# Patient Record
Sex: Female | Born: 1950 | Race: Black or African American | Hispanic: No | Smoking: Smoker, current status unknown
Health system: Southern US, Community
[De-identification: ages and names within clinical notes are randomized; demographics above are authoritative.]

## PROBLEM LIST (undated history)

## (undated) DIAGNOSIS — F419 Anxiety disorder, unspecified: Secondary | ICD-10-CM

## (undated) DIAGNOSIS — F32A Depression, unspecified: Secondary | ICD-10-CM

## (undated) DIAGNOSIS — Z21 Asymptomatic human immunodeficiency virus [HIV] infection status: Secondary | ICD-10-CM

## (undated) DIAGNOSIS — B20 Human immunodeficiency virus [HIV] disease: Secondary | ICD-10-CM

## (undated) DIAGNOSIS — F329 Major depressive disorder, single episode, unspecified: Secondary | ICD-10-CM

## (undated) DIAGNOSIS — I10 Essential (primary) hypertension: Secondary | ICD-10-CM

---

## 2008-12-11 ENCOUNTER — Emergency Department (HOSPITAL_COMMUNITY): Admission: EM | Admit: 2008-12-11 | Discharge: 2008-12-11 | Payer: Self-pay | Admitting: Family Medicine

## 2009-04-03 ENCOUNTER — Ambulatory Visit: Payer: Self-pay | Admitting: Obstetrics & Gynecology

## 2010-02-24 ENCOUNTER — Emergency Department (HOSPITAL_COMMUNITY): Admission: EM | Admit: 2010-02-24 | Discharge: 2010-02-25 | Payer: Self-pay | Admitting: Emergency Medicine

## 2010-02-25 ENCOUNTER — Telehealth: Payer: Self-pay | Admitting: Gastroenterology

## 2010-03-06 ENCOUNTER — Ambulatory Visit: Payer: Self-pay | Admitting: Gastroenterology

## 2010-03-06 DIAGNOSIS — K5909 Other constipation: Secondary | ICD-10-CM

## 2010-03-06 DIAGNOSIS — R1013 Epigastric pain: Secondary | ICD-10-CM | POA: Insufficient documentation

## 2010-03-06 DIAGNOSIS — K219 Gastro-esophageal reflux disease without esophagitis: Secondary | ICD-10-CM

## 2010-03-11 ENCOUNTER — Ambulatory Visit (HOSPITAL_COMMUNITY): Admission: RE | Admit: 2010-03-11 | Discharge: 2010-03-11 | Payer: Self-pay | Admitting: Gastroenterology

## 2010-03-20 ENCOUNTER — Ambulatory Visit: Payer: Self-pay | Admitting: Gastroenterology

## 2010-04-30 ENCOUNTER — Emergency Department (HOSPITAL_COMMUNITY): Admission: EM | Admit: 2010-04-30 | Discharge: 2010-04-30 | Payer: Self-pay | Admitting: Family Medicine

## 2010-10-19 ENCOUNTER — Emergency Department (HOSPITAL_COMMUNITY): Admission: EM | Admit: 2010-10-19 | Discharge: 2010-10-19 | Payer: Self-pay | Admitting: Emergency Medicine

## 2010-12-23 NOTE — Progress Notes (Signed)
Summary: triage  Phone Note Call from Patient Call back at [phone]   Caller: Patient Call For: doc of the day Reason for Call: Talk to Nurse Summary of Call: Patient was in the ER last night with severe abd pain and nausea and was told to call uis and schedule an appt for today. (please call after 11am because pt had to go to pharmacy and pich up rx) Initial call taken by: Tawni Levy,  February 25, 2010 9:34 AM  Follow-up for Phone Call        Talked with pt.  States she was given phenergan and pepcd from  ER.  She just got them filled this am.  States she feels some better today.  ER instructed pt that she needed to see a Gi doctor.  Will get ER records for review. Follow-up by: Ashok Cordia RN,  February 25, 2010 12:37 PM  Additional Follow-up for Phone Call Additional follow up Details #1::        ER records reviewed.  Tried to call pt to sch appt.  No answer.   Ashok Cordia RN  February 26, 2010 8:59 AM  Talked with pt.  States she feels better today.  Appt sch for 03/06/10.  Offered appt on 03/04/10 but pt states she can not come in on 4/12 because she has a eye doctor appt.  instructed pt to call if worsens. Additional Follow-up by: Ashok Cordia RN,  February 26, 2010 11:27 AM

## 2010-12-23 NOTE — Assessment & Plan Note (Signed)
Summary: Abd pain,  ER follow up/dfs   History of Present Illness Visit Type: new patient  Primary GI MD: Sheryn Bison MD FACP FAGA Primary Provider: Micki Riley, DO  Requesting Provider: n/a Chief Complaint: loss of appetite, bloating, and generalized abd pain History of Present Illness:   60 year old African American female chronically HIV-positive palm Combivir and Sustiva b.i.d. per Dr.Koher Kennedy Bucker at [facility] in [place]. She has had regular followup at [facility] with a negative colonoscopy apparently several years ago. She comes to mild today because of recent emergency room visit on April 4 with sudden left upper quadrant abdominal pain, nausea vomiting, gas, bloating, and distention. Labs were all unremarkable as was CT scan of the abdomen and pelvis. She was placed on cimetidine 20 mg daily with p.r.n. promethazine, and is currently fairly asymptomatic.  She denies chronic abdominal complaints but does have periodic acid reflux. She specifically denies dysphagia,previous abdominal surgery, history of hepatitis or pancreatitis. However, she does have severe headaches and uses 4-5  Goody headache powders a day. She denies a known history of gallbladder disease. She also denies abuse of ethanol or cigarettes. He does have chronic anxiety and depression but is not on therapy. Old records are not available for review.   GI Review of Systems    Reports abdominal pain, bloating, and  loss of appetite.     Location of  Abdominal pain: generalized.    Denies acid reflux, belching, chest pain, dysphagia with liquids, dysphagia with solids, heartburn, nausea, vomiting, vomiting blood, weight loss, and  weight gain.        Denies anal fissure, black tarry stools, change in bowel habit, constipation, diarrhea, diverticulosis, fecal incontinence, heme positive stool, hemorrhoids, irritable bowel syndrome, jaundice, light color stool, liver problems, rectal bleeding, and  rectal  pain.    Current Medications (verified): 1)  Combivir 150-300 Mg Tabs (Lamivudine-Zidovudine) .... One Tablet By Mouth Two Times A Day 2)  Promethazine Hcl 25 Mg Tabs (Promethazine Hcl) .... One Tablet By Mouth As Needed For Nausea 3)  Famotidine 20 Mg Tabs (Famotidine) .... One Tablet By Mouth Once Daily 4)  Sustiva 600 Mg Tabs (Efavirenz) .... One Tablet By Mouth Once Daily  Allergies (verified): No Known Drug Allergies  Past History:  Past medical, surgical, family and social histories (including risk factors) reviewed for relevance to current acute and chronic problems.  Past Medical History: HIV Anxiety Disorder Depression  Past Surgical History: Reviewed history from 03/05/2010 and no changes required. Left leg Pinning due to fracture.  Family History: Reviewed history from 03/05/2010 and no changes required. Family History of Diabetes: Multiple Family Members  No FH of Colon Cancer: Family History of Breast Cancer:Mother, and Sister   Social History: Reviewed history from 03/05/2010 and no changes required. Unemployed Widowed No childern Patient currently smokes.  Alcohol Use - no Former Drug use Daily Caffeine Use: 2 daily   Review of Systems       The patient complains of anxiety-new, change in vision, cough, depression-new, headaches-new, night sweats, sleeping problems, and voice change.  The patient denies allergy/sinus, anemia, arthritis/joint pain, back pain, blood in urine, breast changes/lumps, confusion, coughing up blood, fainting, fatigue, fever, hearing problems, heart murmur, heart rhythm changes, itching, menstrual pain, muscle pains/cramps, nosebleeds, pregnancy symptoms, shortness of breath, skin rash, sore throat, swelling of feet/legs, swollen lymph glands, thirst - excessive , urination - excessive , urination changes/pain, urine leakage, and vision changes.    Vital Signs:  Patient profile:  60 year old female Height:      63 inches Weight:       131 pounds BMI:     23.29 BSA:     1.62 Pulse rate:   84 / minute Pulse rhythm:   regular BP sitting:   126 / 76  (left arm) Cuff size:   regular  Vitals Entered By: Ok Anis CMA (March 06, 2010 11:02 AM)  Physical Exam  General:  Well developed, well nourished, no acute distress.healthy appearing.   Head:  Normocephalic and atraumatic. Eyes:  PERRLA, no icterus.exam deferred to patient's ophthalmologist.   Neck:  Supple; no masses or thyromegaly. Lungs:  Clear throughout to auscultation. Heart:  Regular rate and rhythm; no murmurs, rubs,  or bruits. Abdomen:  Hepatic enlargement noted with enlarged right and left lobes of the liver and questionable palpable gallbladder in the right subcostal area. There is rather marked tenderness but no rebound. There is no abdominal bruit and bowel sounds are not obstructive. Extremities:  No clubbing, cyanosis, edema or deformities noted. Neurologic:  Alert and  oriented x4;  grossly normal neurologically. Cervical Nodes:  No significant cervical adenopathy. Inguinal Nodes:  No significant inguinal adenopathy. Psych:  Alert and cooperative. Normal mood and affect.   Impression & Recommendations:  Problem # 1:  ABDOMINAL PAIN, EPIGASTRIC (ICD-789.06) Assessment Improved Probable NSAID damage to the upper GI tract versus cholelithiasis. I'm somewhat concerned about herpetic enlargement and possible palpable gallbladder and we'll repeat her liver enzymes, amylase, lipase, and upper abdominal ultrasound. I've stopped NSAIDs and we'll give her p.r.n. tramadol 50 mg for headaches, Librax p.r.n., AcipHex 20 mg twice a day, and office followup in 2 weeks' time. We will send this report to Dr. Kennedy Bucker at [facility] for review. Also requested her copies of any previous endoscopic exams were history of GI problems. This patient may need endoscopic exam dependent on her clinical course. Orders: TLB-CBC Platelet - w/Differential  (85025-CBCD) TLB-BMP (Basic Metabolic Panel-BMET) (80048-METABOL) TLB-Hepatic/Liver Function Pnl (80076-HEPATIC) TLB-TSH (Thyroid Stimulating Hormone) (84443-TSH) TLB-B12, Serum-Total ONLY (91478-G95) TLB-Ferritin (82728-FER) TLB-Folic Acid (Folate) (82746-FOL) TLB-IBC Pnl (Iron/FE;Transferrin) (83550-IBC) TLB-Amylase (82150-AMYL) TLB-Lipase (83690-LIPASE) TLB-Sedimentation Rate (ESR) (85652-ESR)  Problem # 2:  OTHER CONSTIPATION (ICD-564.09) Assessment: Improved Phillips Colon Health fiber and probiotic tabs twice a day as tolerated TLB-CBC Platelet - w/Differential (85025-CBCD) TLB-BMP (Basic Metabolic Panel-BMET) (80048-METABOL) TLB-Hepatic/Liver Function Pnl (80076-HEPATIC) TLB-TSH (Thyroid Stimulating Hormone) (84443-TSH) TLB-B12, Serum-Total ONLY (62130-Q65) TLB-Ferritin (82728-FER) TLB-Folic Acid (Folate) (82746-FOL) TLB-IBC Pnl (Iron/FE;Transferrin) (83550-IBC) TLB-Amylase (82150-AMYL) TLB-Lipase (83690-LIPASE) TLB-Sedimentation Rate (ESR) (85652-ESR)  Problem # 3:  HIV INFECTION, ASYMPTOMATIC (ICD-V08) Assessment: Improved Continue medications per Dr. Kennedy Bucker. Apparently her viral load is undetectable and she is currently asymptomatic in terms of any active HIV problems.  Patient Instructions: 1)  Please go to the basement for lab work. 2)  Stop all arthritis meds including goody powers. 3)  Stop pepcid. 4)  Begin Aciphex two times a day. 5)  Begin Tramaldol as needed for pain. 6)  Begin Librax as needed for abd pain and spasms. 7)  Begin Vear Clock Colon health two times a day.  Buy this OTC. 8)  You are scheduled for an ultrasound. 9)  The medication list was reviewed and reconciled.  All changed / newly prescribed medications were explained.  A complete medication list was provided to the patient / caregiver. 10)  Please schedule a follow-up appointment in 3 weeks.  11)  Copy sent to : Dr. Micki Riley at [facility] in [place] 78)  Stop Goody powders  and other NSAIDs. 13)  Please continue current medications.   Appended Document: Abd pain,  ER follow up/dfs    Clinical Lists Changes  Medications: Added new medication of TRAMADOL HCL 50 MG TABS (TRAMADOL HCL) 1 by mouth q 8 hrs as needed pain - Signed Added new medication of CLIDINIUM-CHLORDIAZEPOXIDE 2.5-5 MG CAPS (CLIDINIUM-CHLORDIAZEPOXIDE) 1 by mouth three times a day as needed abd pain and spasms - Signed Added new medication of ACIPHEX 20 MG  TBEC (RABEPRAZOLE SODIUM) Take 1 each day 30 minutes before meals Added new medication of PHILLIPS COLON HEALTH  CAPS (PROBIOTIC PRODUCT) two times a day Rx of TRAMADOL HCL 50 MG TABS (TRAMADOL HCL) 1 by mouth q 8 hrs as needed pain;  #40 x 3;  Signed;  Entered by: Ashok Cordia RN;  Authorized by: Mardella Layman MD [facility];  Method used: Electronically to [facility] [address] [place], [place], [place]  [address], Ph: [phone], Fax: [phone] Rx of CLIDINIUM-CHLORDIAZEPOXIDE 2.5-5 MG CAPS (CLIDINIUM-CHLORDIAZEPOXIDE) 1 by mouth three times a day as needed abd pain and spasms;  #60 x 3;  Signed;  Entered by: Ashok Cordia RN;  Authorized by: Mardella Layman MD [facility];  Method used: Electronically to [facility] [address], [place], [place], [place]  [address], Ph: [phone], Fax: [phone] Orders: Added new Test order of Ultrasound Abdomen (UAS) - Signed    Prescriptions: CLIDINIUM-CHLORDIAZEPOXIDE 2.5-5 MG CAPS (CLIDINIUM-CHLORDIAZEPOXIDE) 1 by mouth three times a day as needed abd pain and spasms  #60 x 3   Entered by:   Ashok Cordia RN   Authorized by:   Mardella Layman MD [facility]   Signed by:   Ashok Cordia RN on 03/06/2010   Method used:   Electronically to        [facility] [address] (retail)       [address] [place], [place]  [address]       Ph: [phone]       Fax: [phone]   RxID:   [id number] TRAMADOL HCL 50 MG TABS  (TRAMADOL HCL) 1 by mouth q 8 hrs as needed pain  #40 x 3   Entered by:   Ashok Cordia RN   Authorized by:   Mardella Layman MD [facility]   Signed by:   Ashok Cordia RN on 03/06/2010   Method used:   Electronically to        [facility] [address] (retail)       [address] [place], [place]  [address]       Ph: [phone]       Fax: [phone]   RxID:   [id number]

## 2010-12-23 NOTE — Assessment & Plan Note (Signed)
Summary: 2 WEEK F/U..AM.   History of Present Illness Visit Type: Follow-up Visit Primary GI MD: Sheryn Bison MD FACP FAGA Primary Provider: Micki Riley, DO  Requesting Provider: n/a Chief Complaint: Constipation and bloating, Pt has to strain to go to the bathroom and it makes her dizzy History of Present Illness:   Her epigastric pain has resolved on AcipHex. He continues to have constipation, gas and bloating. The lab was unable to draw her blood because of poor vein access. She continues with constipation gas and bloating. Upper abdominal ultrasound exam is unremarkable without evidence of significant organomegaly.   GI Review of Systems    Reports bloating.      Denies abdominal pain, acid reflux, belching, chest pain, dysphagia with liquids, dysphagia with solids, heartburn, loss of appetite, nausea, vomiting, vomiting blood, weight loss, and  weight gain.      Reports constipation.     Denies anal fissure, black tarry stools, change in bowel habit, diarrhea, diverticulosis, fecal incontinence, heme positive stool, hemorrhoids, irritable bowel syndrome, jaundice, light color stool, liver problems, rectal bleeding, and  rectal pain.    Current Medications (verified): 1)  Combivir 150-300 Mg Tabs (Lamivudine-Zidovudine) .... One Tablet By Mouth Two Times A Day 2)  Promethazine Hcl 25 Mg Tabs (Promethazine Hcl) .... One Tablet By Mouth As Needed For Nausea 3)  Famotidine 20 Mg Tabs (Famotidine) .... One Tablet By Mouth Once Daily 4)  Sustiva 600 Mg Tabs (Efavirenz) .... One Tablet By Mouth Once Daily 5)  Tramadol Hcl 50 Mg Tabs (Tramadol Hcl) .Marland Kitchen.. 1 By Mouth Q 8 Hrs As Needed Pain 6)  Aciphex 20 Mg  Tbec (Rabeprazole Sodium) .... Take 1 Each Day 30 Minutes Before Meals  Allergies (verified): No Known Drug Allergies  Past History:  Family History: Last updated: 03/06/2010 Family History of Diabetes: Multiple Family Members  No FH of Colon Cancer: Family History of Breast  Cancer:Mother, and Sister   Social History: Last updated: 03/06/2010 Unemployed Widowed No childern Patient currently smokes.  Alcohol Use - no Former Drug use Daily Caffeine Use: 2 daily   Past medical, surgical, family and social histories (including risk factors) reviewed for relevance to current acute and chronic problems.  Past Medical History: Reviewed history from 03/06/2010 and no changes required. HIV Anxiety Disorder Depression  Past Surgical History: Reviewed history from 03/05/2010 and no changes required. Left leg Pinning due to fracture.  Family History: Reviewed history from 03/06/2010 and no changes required. Family History of Diabetes: Multiple Family Members  No FH of Colon Cancer: Family History of Breast Cancer:Mother, and Sister   Social History: Reviewed history from 03/06/2010 and no changes required. Unemployed Widowed No childern Patient currently smokes.  Alcohol Use - no Former Drug use Daily Caffeine Use: 2 daily   Review of Systems       The patient complains of sleeping problems.  The patient denies allergy/sinus, anemia, anxiety-new, arthritis/joint pain, back pain, blood in urine, breast changes/lumps, change in vision, confusion, cough, coughing up blood, depression-new, fainting, fatigue, fever, headaches-new, hearing problems, heart murmur, heart rhythm changes, itching, menstrual pain, muscle pains/cramps, night sweats, nosebleeds, pregnancy symptoms, shortness of breath, skin rash, sore throat, swelling of feet/legs, swollen lymph glands, thirst - excessive , urination - excessive , urination changes/pain, urine leakage, vision changes, and voice change.    Vital Signs:  Patient profile:   60 year old female Height:      63 inches Weight:      132 pounds  BMI:     23.47 BSA:     1.62 Pulse rate:   80 / minute Pulse rhythm:   regular BP sitting:   130 / 78  (left arm)  Vitals Entered By: Merri Ray CMA Duncan Dull) (March 20, 2010 9:50 AM)  Physical Exam  General:  Well developed, well nourished, no acute distress.healthy appearing.   Head:  Normocephalic and atraumatic. Eyes:  PERRLA, no icterus.exam deferred to patient's ophthalmologist.   Abdomen:  Soft, nontender and nondistended. No masses, hepatosplenomegaly or hernias noted. Normal bowel sounds.No significant distention, masses or tenderness noted. Psych:  Alert and cooperative. Normal mood and affect.   Impression & Recommendations:  Problem # 1:  HIV INFECTION, ASYMPTOMATIC (ICD-V08) Assessment Unchanged Continue current medications and followup at [facility] as scheduled  Problem # 2:  OTHER CONSTIPATION (ICD-564.09) Assessment: Unchanged MiraLax equivalent antacids regularly at bedtime. If this does not help we will try Amitiza 8 micrograms twice a day. However, this patient has problems with payment for drugs, and this may be unattainable.  Problem # 3:  ESOPHAGEAL REFLUX (ICD-530.81) Assessment: Improved Continue AcipHex 20 mg q.a.m. along with standard anti-reflux maneuvers. Further samples of AcipHex have been supplied.  Patient Instructions: 1)  Begin Dulcolax Balance at bedtime. 2)  Please continue current medications.  3)  Please reoprt back in a few weeks. 4)  The medication list was reviewed and reconciled.  All changed / newly prescribed medications were explained.  A complete medication list was provided to the patient / caregiver. 5)  Please call our GI Office back in 2 weeks with a follow-up of symptoms. 6)  Copy sent to : Dr. Micki Riley at [facility] in [place], [place]. 7)  Please continue current medications.   Appended Document: 2 WEEK F/U..AM.    Clinical Lists Changes  Medications: Added new medication of DULCOLAX BALANCE  POWD (POLYETHYLENE GLYCOL 3350) q hs

## 2011-02-11 LAB — COMPREHENSIVE METABOLIC PANEL
Calcium: 9 mg/dL (ref 8.4–10.5)
Creatinine, Ser: 0.73 mg/dL (ref 0.4–1.2)
GFR calc Af Amer: >60 mL/min (ref >60)
GFR calc non Af Amer: >60 mL/min (ref >60)
Potassium: 3.8 mEq/L (ref 3.5–5.1)
Sodium: 138 mEq/L (ref 135–145)
Total Bilirubin: 0.8 mg/dL (ref 0.3–1.2)

## 2011-02-11 LAB — DIFFERENTIAL
Basophils Absolute: 0 10*3/uL (ref 0.0–0.1)
Eosinophils Relative: 3 % (ref 0–5)
Lymphocytes Relative: 60 % — ABNORMAL HIGH (ref 12–46)
Lymphs Abs: 4.5 10*3/uL — ABNORMAL HIGH (ref 0.7–4.0)
Monocytes Relative: 9 % (ref 3–12)
Neutrophils Relative %: 28 % — ABNORMAL LOW (ref 43–77)

## 2011-02-11 LAB — LIPASE, BLOOD: Lipase: 25 U/L (ref 11–59)

## 2011-02-11 LAB — URINALYSIS, ROUTINE W REFLEX MICROSCOPIC
Bilirubin Urine: NEGATIVE
Nitrite: NEGATIVE
Specific Gravity, Urine: 1.014 (ref 1.005–1.030)
pH: 8 (ref 5.0–8.0)

## 2011-02-11 LAB — URINE MICROSCOPIC-ADD ON

## 2011-02-11 LAB — CBC
Hemoglobin: 12.3 g/dL (ref 12.0–15.0)
Platelets: 152 10*3/uL (ref 150–400)
RDW: 14.3 % (ref 11.5–15.5)

## 2011-03-09 LAB — POCT URINALYSIS DIP (DEVICE)
Glucose, UA: NEGATIVE mg/dL
Nitrite: NEGATIVE
pH: 6.5 (ref 5.0–8.0)

## 2011-04-05 IMAGING — CR DG KNEE COMPLETE 4+V*L*
4 series · 4 of 4 positions shown · non-contrast
Comparison: None.

CLINICAL DATA: Anterior knee pain for 2 days.  No injury.

LEFT KNEE - COMPLETE 4+ VIEW

[view not recorded (1 of 4)]
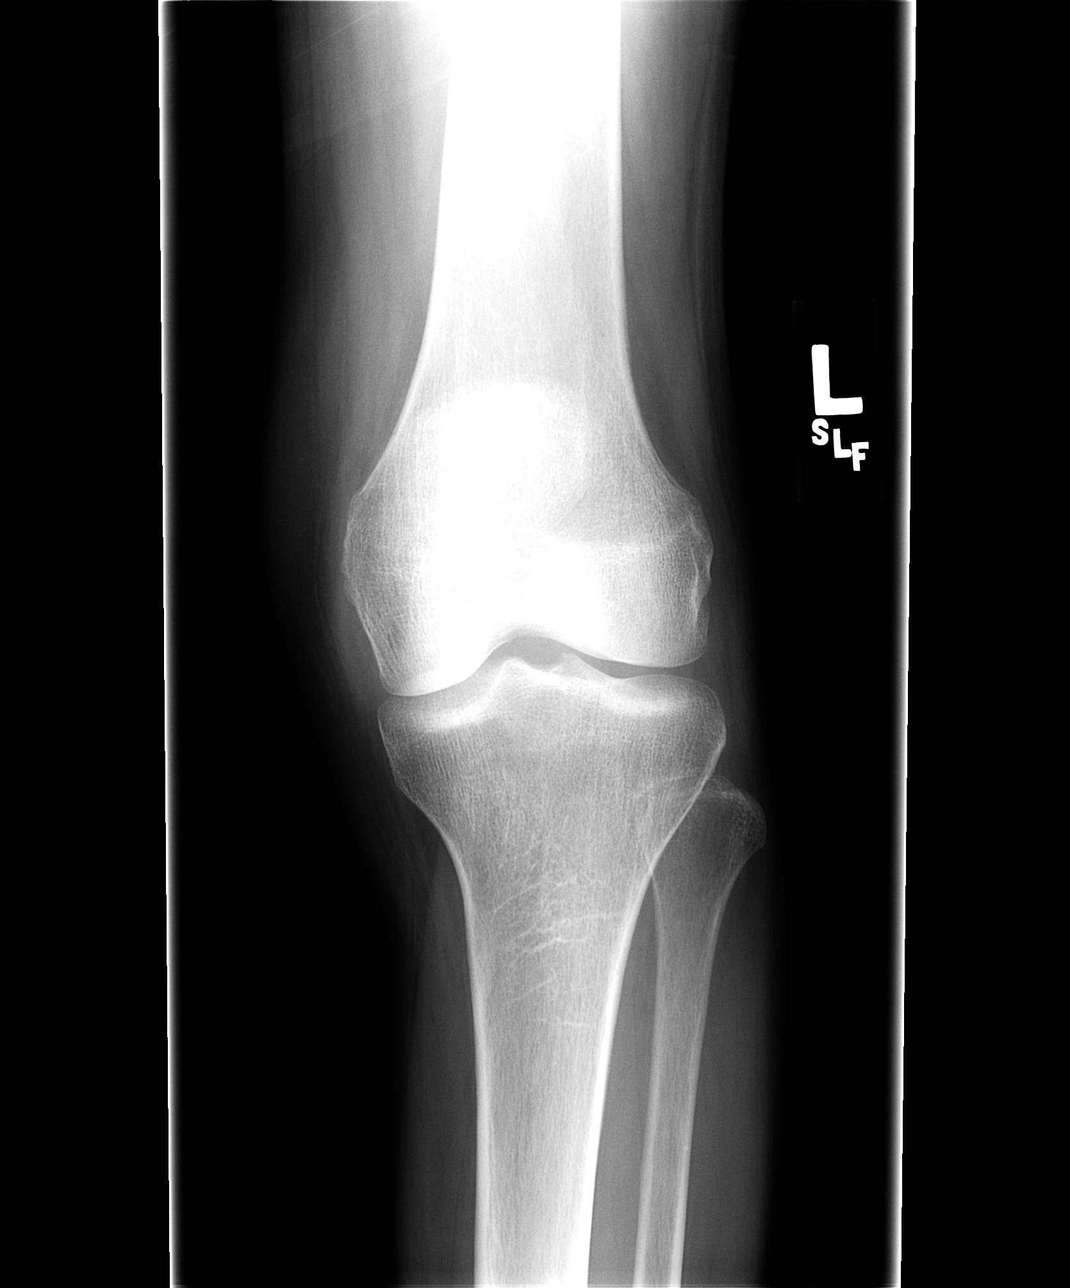

[view not recorded (2 of 4)]
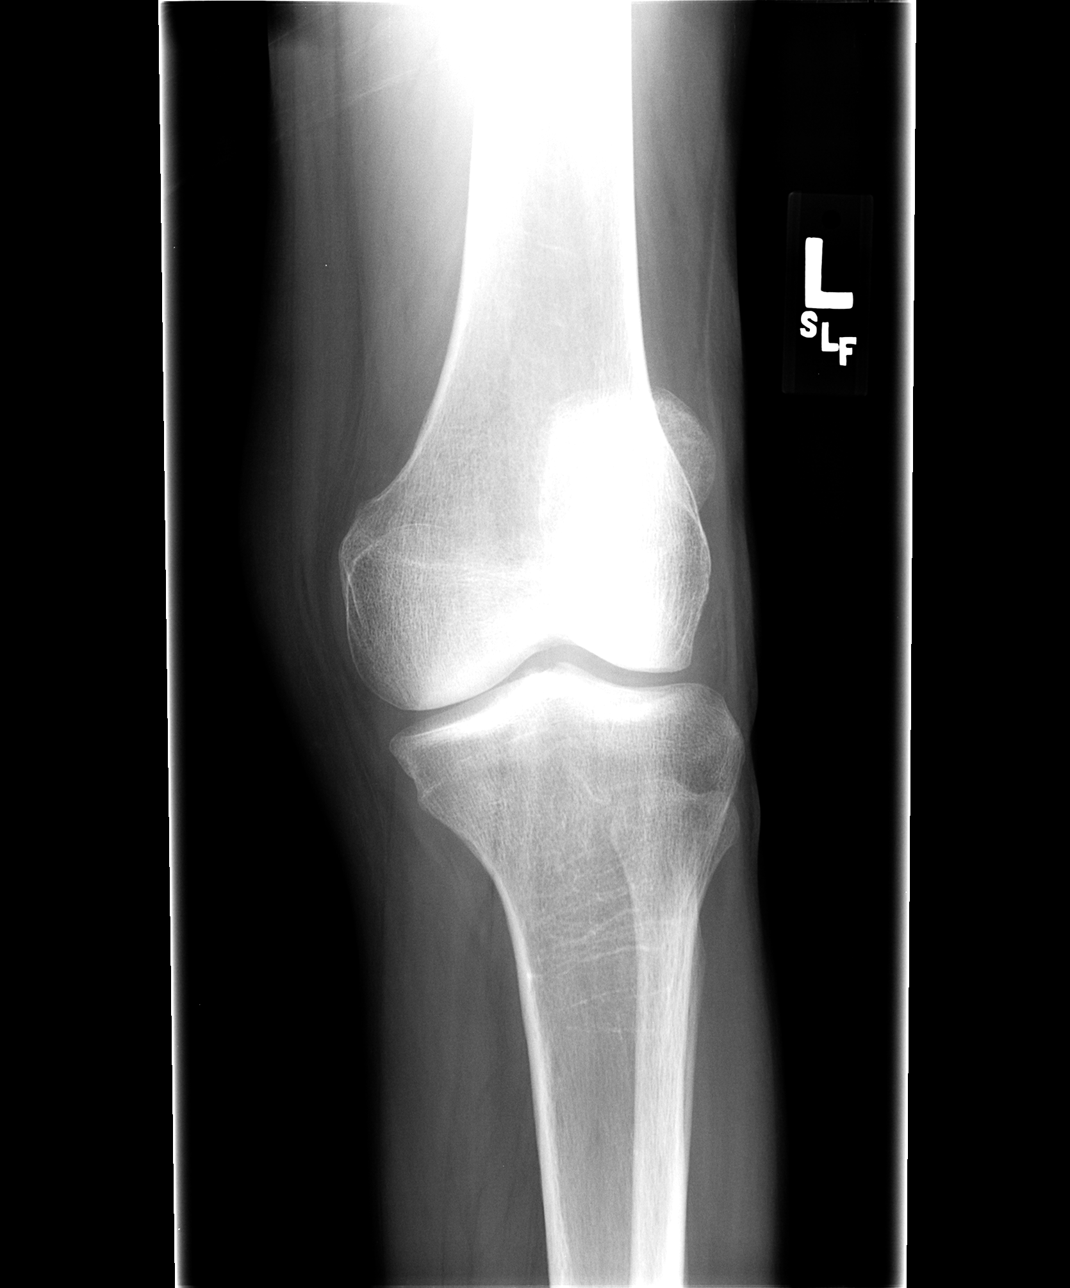

[view not recorded (3 of 4)]
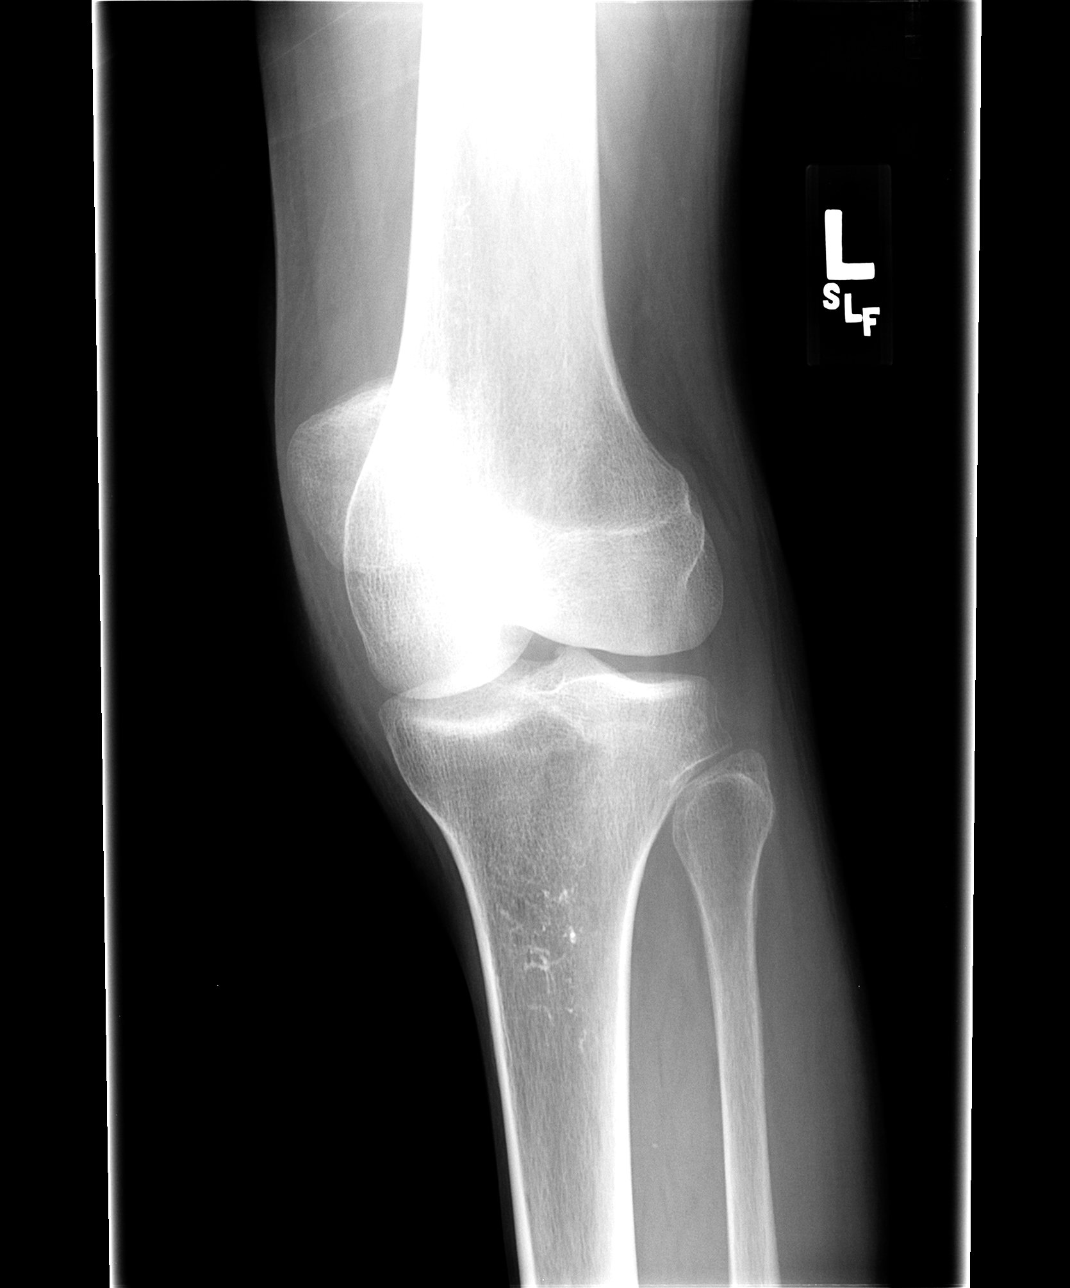

[view not recorded (4 of 4)]
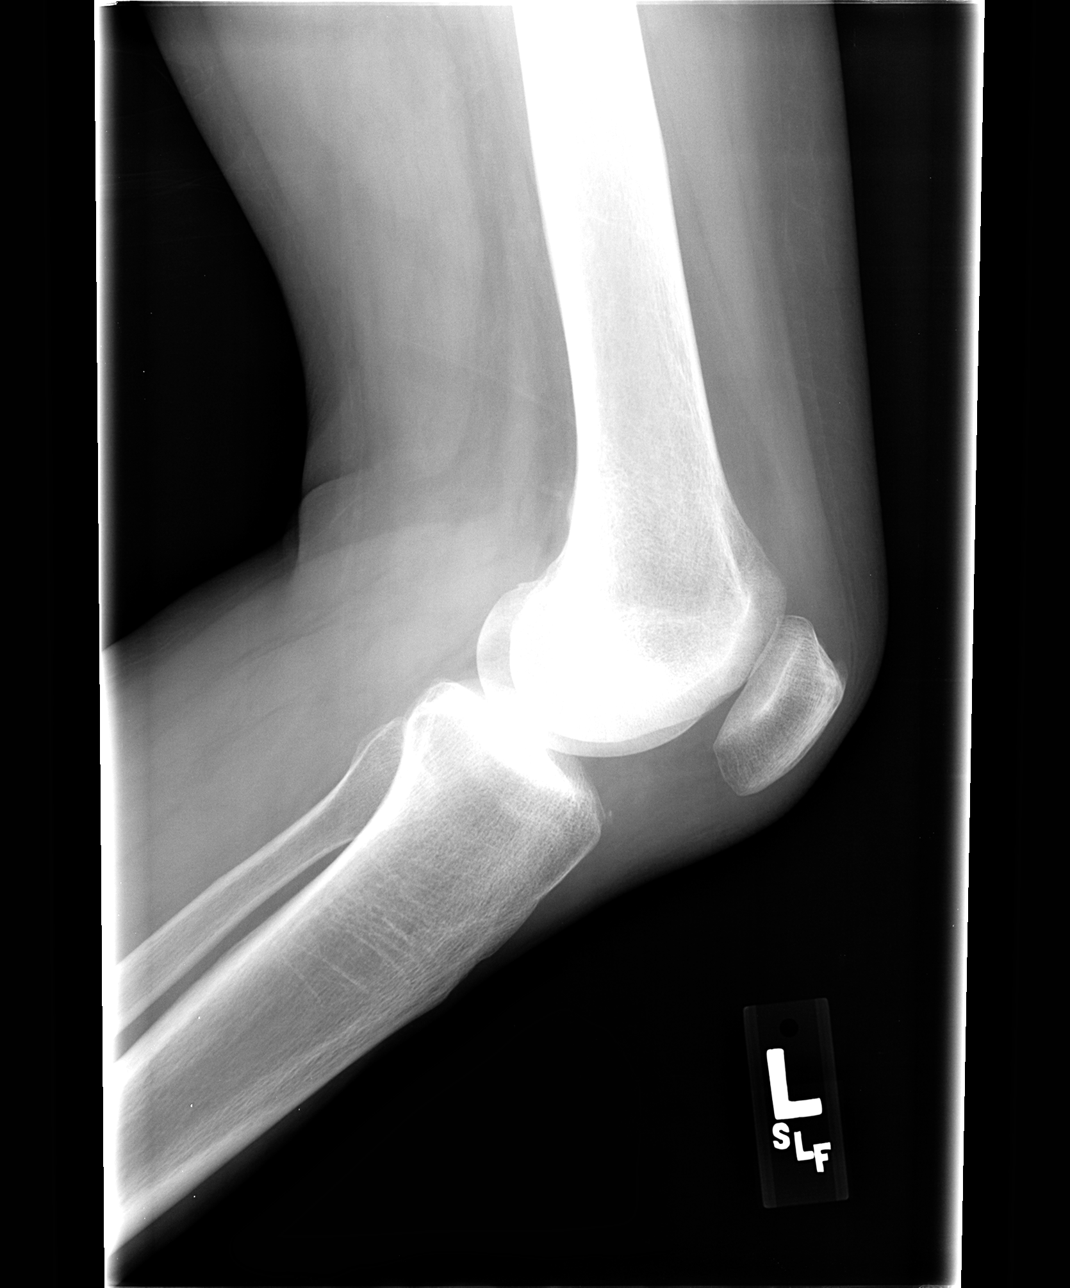

[4 of 4 positions shown; findings below may reference images not displayed]

FINDINGS: There is no evidence of fracture, dislocation, or joint
effusion.  There is no evidence of arthropathy or other focal bone
abnormality.  Soft tissues are unremarkable.
IMPRESSION: Negative.
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
## 2011-06-01 ENCOUNTER — Emergency Department (HOSPITAL_COMMUNITY)
Admission: EM | Admit: 2011-06-01 | Discharge: 2011-06-01 | Disposition: A | Discharge status: Home / Self Care | Payer: Medicare Other | Attending: Emergency Medicine | Admitting: Emergency Medicine

## 2011-06-01 ENCOUNTER — Emergency Department (HOSPITAL_COMMUNITY): Payer: Medicare Other

## 2011-06-01 DIAGNOSIS — Z21 Asymptomatic human immunodeficiency virus [HIV] infection status: Secondary | ICD-10-CM | POA: Insufficient documentation

## 2011-06-01 DIAGNOSIS — F17200 Nicotine dependence, unspecified, uncomplicated: Secondary | ICD-10-CM | POA: Insufficient documentation

## 2011-06-01 DIAGNOSIS — J40 Bronchitis, not specified as acute or chronic: Secondary | ICD-10-CM | POA: Insufficient documentation

## 2011-06-01 DIAGNOSIS — Z79899 Other long term (current) drug therapy: Secondary | ICD-10-CM | POA: Insufficient documentation

## 2011-11-03 ENCOUNTER — Emergency Department (HOSPITAL_COMMUNITY): Payer: Medicare Other

## 2011-11-03 ENCOUNTER — Emergency Department (HOSPITAL_COMMUNITY)
Admission: EM | Admit: 2011-11-03 | Discharge: 2011-11-03 | Disposition: A | Discharge status: Home / Self Care | Payer: Medicare Other | Attending: Emergency Medicine | Admitting: Emergency Medicine

## 2011-11-03 ENCOUNTER — Encounter: Payer: Self-pay | Admitting: *Deleted

## 2011-11-03 DIAGNOSIS — M25439 Effusion, unspecified wrist: Secondary | ICD-10-CM | POA: Insufficient documentation

## 2011-11-03 DIAGNOSIS — X500XXA Overexertion from strenuous movement or load, initial encounter: Secondary | ICD-10-CM | POA: Insufficient documentation

## 2011-11-03 DIAGNOSIS — S63509A Unspecified sprain of unspecified wrist, initial encounter: Secondary | ICD-10-CM | POA: Insufficient documentation

## 2011-11-03 DIAGNOSIS — M25539 Pain in unspecified wrist: Secondary | ICD-10-CM | POA: Insufficient documentation

## 2011-11-03 DIAGNOSIS — F341 Dysthymic disorder: Secondary | ICD-10-CM | POA: Insufficient documentation

## 2011-11-03 DIAGNOSIS — S63501A Unspecified sprain of right wrist, initial encounter: Secondary | ICD-10-CM

## 2011-11-03 DIAGNOSIS — Z79899 Other long term (current) drug therapy: Secondary | ICD-10-CM | POA: Insufficient documentation

## 2011-11-03 DIAGNOSIS — Z21 Asymptomatic human immunodeficiency virus [HIV] infection status: Secondary | ICD-10-CM | POA: Insufficient documentation

## 2011-11-03 HISTORY — DX: Depression, unspecified: F32.A

## 2011-11-03 HISTORY — DX: Major depressive disorder, single episode, unspecified: F32.9

## 2011-11-03 HISTORY — DX: Asymptomatic human immunodeficiency virus (hiv) infection status: Z21

## 2011-11-03 HISTORY — DX: Anxiety disorder, unspecified: F41.9

## 2011-11-03 HISTORY — DX: Human immunodeficiency virus (HIV) disease: B20

## 2011-11-03 MED ORDER — HYDROCODONE-ACETAMINOPHEN 5-325 MG PO TABS
1.0000 | ORAL_TABLET | Freq: Once | ORAL | Status: AC
  Administered 2011-11-03: 1 via ORAL
  Filled 2011-11-03: qty 1

## 2011-11-03 MED ORDER — IBUPROFEN 800 MG PO TABS: 800.0000 mg | ORAL_TABLET | Freq: Three times a day (TID) | ORAL | Status: AC

## 2011-11-03 MED ORDER — TRAMADOL HCL 50 MG PO TABS: 50.0000 mg | ORAL_TABLET | Freq: Four times a day (QID) | ORAL | Status: AC | PRN

## 2011-11-03 NOTE — ED Provider Notes (Signed)
History     CSN: [id number] Arrival date & time: 11/03/2011  6:54 PM   First MD Initiated Contact with Patient 11/03/11 2159      Chief Complaint  Patient presents with  . Wrist Pain    (Consider location/radiation/quality/duration/timing/severity/associated sxs/prior treatment) Patient is a 60 y.o. female presenting with wrist pain. The history is provided by the patient.  Wrist Pain This is a new problem. The current episode started today. The problem occurs constantly. The problem has been unchanged. Associated symptoms include joint swelling. Pertinent negatives include no chest pain, chills, coughing, fever, numbness or weakness. The symptoms are aggravated by bending. She has tried nothing for the symptoms.   the patient was helping move a couch while preparing to put a Christmas tree when she had what appears to be a hyperextension of the right wrist with a "pop". She notes swelling to the right thumb. Denies any numbness but does note some sensation of tingling to all digits. She is right-hand dominant.  Past Medical History  Diagnosis Date  . HIV (human immunodeficiency virus infection)   . Anxiety   . Depression     History reviewed. No pertinent past surgical history.  History reviewed. No pertinent family history.  History  Substance Use Topics  . Smoking status: Smoker, Current Status Unknown  . Smokeless tobacco: Not on file  . Alcohol Use: No     Review of Systems  Constitutional: Negative for fever and chills.  Respiratory: Negative for cough and shortness of breath.   Cardiovascular: Negative for chest pain.  Musculoskeletal: Positive for joint swelling.       Positive right wrist pain  Skin: Negative for color change and wound.  Neurological: Negative for syncope, weakness and numbness.    Allergies  Review of patient's allergies indicates no known allergies.  Home Medications   Current Outpatient Rx  Name Route Sig Dispense Refill  .  EFAVIRENZ 600 MG PO TABS Oral Take 600 mg by mouth at bedtime.      Marland Kitchen LAMIVUDINE-ZIDOVUDINE 150-300 MG PO TABS Oral Take 1 tablet by mouth 2 (two) times daily.        BP 157/74  Pulse 96  Temp(Src) 99 F (37.2 C) (Oral)  Resp 16  SpO2 100%  Physical Exam  Nursing note and vitals reviewed. Constitutional: She is oriented to person, place, and time. She appears well-developed and well-nourished. No distress.  HENT:  Head: Normocephalic and atraumatic.  Right Ear: External ear normal.  Left Ear: External ear normal.  Eyes: Conjunctivae are normal. Pupils are equal, round, and reactive to light.  Neck: Normal range of motion. Neck supple.  Cardiovascular: Normal rate and regular rhythm.   Pulmonary/Chest:       Normal respiratory effort and excursion  Abdominal: Soft. She exhibits no distension. There is no tenderness.  Musculoskeletal:       Right wrist: She exhibits decreased range of motion, tenderness and swelling. She exhibits no crepitus, no deformity and no laceration.       Pt with TTP right volar wrist, radial aspect. Does not cooperate with testing of wrist or finger ROM or strength secondary to pain. Passive ROM intact with moderate pain. Small amount of edema to base of right thumb. Capillary refill <2seconds  Neurological: She is alert and oriented to person, place, and time. No cranial nerve deficit. Coordination normal.       Sensation intact to light touch  Skin: Skin is warm and dry. No rash noted.  ED Course  Procedures (including critical care time)  Labs Reviewed - No data to display Dg Wrist Complete Right  11/03/2011  *RADIOLOGY REPORT*  Clinical Data: Injury with pain.  RIGHT WRIST - COMPLETE 3+ VIEW  Comparison: None.  Findings: There is no evidence for acute fracture.  No dislocation. Carpal alignment is intact.  Soft tissues are unremarkable.  IMPRESSION: Normal exam.  Original Report Authenticated By: ERIC A. MANSELL, M.D.     Diagnosis #1: Right  wrist sprain   MDM  X-ray reviewed. Minor wrist injury with no apparent fracture. The patient is to wear a splint for the next week and followup with orthopedics if she has not had improvement in her wrist pain. I will write her for prescription for ibuprofen as well as some pain medication.        Elwyn Reach [place], [place] 11/03/11 2302

## 2011-11-03 NOTE — ED Notes (Signed)
To ed for eval of right wrist pain past hearing it 'snap' while putting up Christmas tree. Good cms. Swelling noted to right thumb.

## 2011-11-03 NOTE — ED Notes (Signed)
Patient states she picked up something and her hand bent all the way backwards. States hand is swollen. States she is unable to bend hand and needles in the tips.  Unable to let her hand down due to pounding pain. Took aspirin and icy hot before arrival. Rates 8/10.

## 2011-11-04 NOTE — ED Provider Notes (Signed)
Evaluation and management procedures were performed by the PA/NP under my supervision/collaboration.   Casin Federici, MD 11/04/11 0032 

## 2012-08-11 ENCOUNTER — Emergency Department (INDEPENDENT_AMBULATORY_CARE_PROVIDER_SITE_OTHER)
Admission: EM | Admit: 2012-08-11 | Discharge: 2012-08-11 | Disposition: A | Discharge status: Home / Self Care | Payer: Medicare Other | Source: Home / Self Care

## 2012-08-11 ENCOUNTER — Emergency Department (INDEPENDENT_AMBULATORY_CARE_PROVIDER_SITE_OTHER): Payer: Medicare Other

## 2012-08-11 ENCOUNTER — Encounter (HOSPITAL_COMMUNITY): Payer: Self-pay | Admitting: *Deleted

## 2012-08-11 DIAGNOSIS — M25559 Pain in unspecified hip: Secondary | ICD-10-CM

## 2012-08-11 DIAGNOSIS — M7062 Trochanteric bursitis, left hip: Secondary | ICD-10-CM

## 2012-08-11 DIAGNOSIS — M76899 Other specified enthesopathies of unspecified lower limb, excluding foot: Secondary | ICD-10-CM

## 2012-08-11 DIAGNOSIS — M25552 Pain in left hip: Secondary | ICD-10-CM

## 2012-08-11 HISTORY — DX: Essential (primary) hypertension: I10

## 2012-08-11 MED ORDER — KETOROLAC TROMETHAMINE 60 MG/2ML IM SOLN
INTRAMUSCULAR | Status: AC
  Filled 2012-08-11: qty 2

## 2012-08-11 MED ORDER — KETOROLAC TROMETHAMINE 60 MG/2ML IM SOLN
60.0000 mg | Freq: Once | INTRAMUSCULAR | Status: AC
  Administered 2012-08-11: 60 mg via INTRAMUSCULAR

## 2012-08-11 MED ORDER — NAPROXEN 500 MG PO TABS: 500.0000 mg | ORAL_TABLET | Freq: Two times a day (BID) | ORAL | Status: DC

## 2012-08-11 NOTE — ED Provider Notes (Signed)
History     CSN: [id number]  Arrival date & time 08/11/12  1139   None     Chief Complaint  Patient presents with  . Hip Pain    (Consider location/radiation/quality/duration/timing/severity/associated sxs/prior treatment) Patient is a 61 y.o. female presenting with hip pain. The history is provided by the patient.  Hip Pain This is a new problem.  Patient reports she was wallking in [employer] yesterday and she felt like her left hip popped out.  States since then pain with movement worse with ambulation, pain is improved with rest, states she has not taken medication for symptoms.  Denies history of same.     Past Medical History  Diagnosis Date  . HIV (human immunodeficiency virus infection)   . Anxiety   . Depression   . Hypertension     History reviewed. No pertinent past surgical history.  Family History  Problem Relation Age of Onset  . Family history unknown: Yes    History  Substance Use Topics  . Smoking status: Smoker, Current Status Unknown    Types: Cigarettes  . Smokeless tobacco: Not on file  . Alcohol Use: No    OB History    Grav Para Term Preterm Abortions TAB SAB Ect Mult Living                  Review of Systems  Constitutional: Negative.   Respiratory: Negative.   Cardiovascular: Negative.   Musculoskeletal: Positive for arthralgias and gait problem. Negative for myalgias, back pain and joint swelling.    Allergies  Review of patient's allergies indicates no known allergies.  Home Medications   Current Outpatient Rx  Name Route Sig Dispense Refill  . EFAVIRENZ 600 MG PO TABS Oral Take 600 mg by mouth at bedtime.      Marland Kitchen HYDROCHLOROTHIAZIDE 25 MG PO TABS Oral Take 25 mg by mouth daily.    Marland Kitchen LAMIVUDINE-ZIDOVUDINE 150-300 MG PO TABS Oral Take 1 tablet by mouth 2 (two) times daily.      Marland Kitchen NAPROXEN 500 MG PO TABS Oral Take 1 tablet (500 mg total) by mouth 2 (two) times daily. 60 tablet 3    BP 150/68  Pulse 93  Temp 97.8 F (36.6 C)  (Oral)  Resp 18  SpO2 99%  Physical Exam  Nursing note and vitals reviewed. Constitutional: She is oriented to person, place, and time. Vital signs are normal. She appears well-developed and well-nourished. She is active and cooperative.  HENT:  Head: Normocephalic.  Eyes: Conjunctivae normal are normal. Pupils are equal, round, and reactive to light. No scleral icterus.  Neck: Trachea normal. Neck supple.  Cardiovascular: Normal rate and regular rhythm.   Pulmonary/Chest: Effort normal and breath sounds normal.  Musculoskeletal:       Right hip: Normal.       Left hip: She exhibits tenderness. She exhibits normal range of motion, normal strength, no bony tenderness, no swelling, no crepitus, no deformity and no laceration.       Left hip tenderness over trochanter, no erythema or swelling.  Neurological: She is alert and oriented to person, place, and time. No cranial nerve deficit or sensory deficit.  Skin: Skin is warm and dry.  Psychiatric: She has a normal mood and affect. Her speech is normal and behavior is normal. Judgment and thought content normal. Cognition and memory are normal.    ED Course  Procedures (including critical care time)  Labs Reviewed - No data to display No results found.  1. Trochanteric bursitis of left hip   2. Left hip pain       MDM  NSAIDS for pain relief, discussed ortho intervention if symptoms are not improved.  Patient diet related blood pressure management additionally discussed.  Patient reports she had been eating mostly salads and fruit for nutrition to control her blood pressure, discussed importance of protein consumption.         Johnsie Kindred, NP 08/15/12 1017

## 2012-08-11 NOTE — ED Notes (Signed)
Pt reports left hip pain for the past week with no known injury. Unable to bear weight on left side

## 2012-08-18 NOTE — ED Provider Notes (Signed)
Medical screening examination/treatment/procedure(s) were performed by non-physician practitioner and as supervising physician I was immediately available for consultation/collaboration.  Leslee Home, M.D.   Reuben Likes, MD 08/18/12 [phone]

## 2013-06-22 ENCOUNTER — Emergency Department (HOSPITAL_COMMUNITY): Payer: Medicare Other

## 2013-06-22 ENCOUNTER — Encounter (HOSPITAL_COMMUNITY): Payer: Self-pay | Admitting: *Deleted

## 2013-06-22 ENCOUNTER — Emergency Department (HOSPITAL_COMMUNITY)
Admission: EM | Admit: 2013-06-22 | Discharge: 2013-06-22 | Disposition: A | Discharge status: Home / Self Care | Payer: Medicare Other | Attending: Emergency Medicine | Admitting: Emergency Medicine

## 2013-06-22 DIAGNOSIS — Y9389 Activity, other specified: Secondary | ICD-10-CM | POA: Insufficient documentation

## 2013-06-22 DIAGNOSIS — W19XXXA Unspecified fall, initial encounter: Secondary | ICD-10-CM

## 2013-06-22 DIAGNOSIS — I10 Essential (primary) hypertension: Secondary | ICD-10-CM | POA: Insufficient documentation

## 2013-06-22 DIAGNOSIS — Z8659 Personal history of other mental and behavioral disorders: Secondary | ICD-10-CM | POA: Insufficient documentation

## 2013-06-22 DIAGNOSIS — Y92410 Unspecified street and highway as the place of occurrence of the external cause: Secondary | ICD-10-CM | POA: Insufficient documentation

## 2013-06-22 DIAGNOSIS — Z79899 Other long term (current) drug therapy: Secondary | ICD-10-CM | POA: Insufficient documentation

## 2013-06-22 DIAGNOSIS — Z21 Asymptomatic human immunodeficiency virus [HIV] infection status: Secondary | ICD-10-CM | POA: Insufficient documentation

## 2013-06-22 DIAGNOSIS — W010XXA Fall on same level from slipping, tripping and stumbling without subsequent striking against object, initial encounter: Secondary | ICD-10-CM | POA: Insufficient documentation

## 2013-06-22 DIAGNOSIS — M549 Dorsalgia, unspecified: Secondary | ICD-10-CM

## 2013-06-22 DIAGNOSIS — IMO0002 Reserved for concepts with insufficient information to code with codable children: Secondary | ICD-10-CM | POA: Insufficient documentation

## 2013-06-22 MED ORDER — HYDROCODONE-ACETAMINOPHEN 5-325 MG PO TABS: 1.0000 | ORAL_TABLET | ORAL | Status: DC | PRN

## 2013-06-22 MED ORDER — FENTANYL CITRATE 0.05 MG/ML IJ SOLN
25.0000 ug | Freq: Once | INTRAMUSCULAR | Status: AC
  Administered 2013-06-22: 25 ug via INTRAVENOUS
  Filled 2013-06-22 (×2): qty 2

## 2013-06-22 MED ORDER — MORPHINE SULFATE 4 MG/ML IJ SOLN: 4.0000 mg | Freq: Once | INTRAMUSCULAR | Status: DC

## 2013-06-22 NOTE — ED Notes (Signed)
To ED via [facility] from home for treatment after tripping in her kitchen. Fall witnessed and no LOC noted. Pt alert and immobilized on arrival. Possible ETOH

## 2013-06-22 NOTE — ED Provider Notes (Signed)
Presents with low nonradiating back pain after after she slipped on a floor at home today. No loss of bladder or bowel control no other complaint. She denies loss of consciousness or headache denies neck pain. Exam she is alert Glasgow Coma Score 15 appears uncomfortable HEENT exam normocephalic atraumatic spine is tender over lumbar area she is no tenderness over the cervical spine or thoracic spine. DTRs symmetric bilaterally knee jerk ankle jerk and biceps was no bilaterally motors are 5 over 5 overall. Cervical spine is cleared via nexus criteria  Doug Sou, MD 06/23/13 [phone]

## 2013-06-22 NOTE — ED Provider Notes (Addendum)
CSN: [id number]     Arrival date & time 06/22/13  1740 History     First MD Initiated Contact with Patient 06/22/13 1750     Chief Complaint  Patient presents with  . Fall   (Consider location/radiation/quality/duration/timing/severity/associated sxs/prior Treatment) HPI 62 year old female with history of HIV presents after mechanical fall from standing in kitchen via EMS with lower back pain.  Patient states she slipped on socks in kitchen and fell landing on her back with immediate pain to her lower back. Reports she was unable to get up secondary to pain and EMS was called.  Fall witnessed and denies LOC, headache, neck pain, numbness.  States she is afraid to move legs.  Pain is severe.  No other injuries, denies use of anticoagulants.   Past Medical History  Diagnosis Date  . HIV (human immunodeficiency virus infection)   . Anxiety   . Depression   . Hypertension    History reviewed. No pertinent past surgical history. History reviewed. No pertinent family history. History  Substance Use Topics  . Smoking status: Smoker, Current Status Unknown    Types: Cigarettes  . Smokeless tobacco: Not on file  . Alcohol Use: No   OB History   Grav Para Term Preterm Abortions TAB SAB Ect Mult Living                 Review of Systems  Constitutional: Negative for fever.  HENT: Negative for sore throat, neck pain and neck stiffness.   Eyes: Negative for visual disturbance.  Respiratory: Negative for cough and shortness of breath.   Cardiovascular: Negative for chest pain.  Gastrointestinal: Negative for nausea, vomiting, abdominal pain and diarrhea.  Genitourinary: Negative for dysuria.  Musculoskeletal: Positive for back pain.  Skin: Negative for rash.  Neurological: Negative for syncope, light-headedness and numbness.    Allergies  Review of patient's allergies indicates no known allergies.  Home Medications   Current Outpatient Rx  Name  Route  Sig  Dispense  Refill  .  Cyanocobalamin (VITAMIN B-12 PO)   Oral   Take 1 tablet by mouth daily.         . hydrochlorothiazide (HYDRODIURIL) 25 MG tablet   Oral   Take 25 mg by mouth daily.          There were no vitals taken for this visit. Physical Exam  Nursing note and vitals reviewed. Constitutional: She is oriented to person, place, and time. She appears well-developed and well-nourished. No distress. Cervical collar and backboard in place.  HENT:  Head: Normocephalic and atraumatic.  Eyes: Conjunctivae and EOM are normal.  Neck: Normal range of motion. No spinous process tenderness present. No rigidity. Normal range of motion present.  Cardiovascular: Normal rate, regular rhythm, normal heart sounds and intact distal pulses.  Exam reveals no gallop and no friction rub.   No murmur heard. Pulmonary/Chest: Effort normal and breath sounds normal. No respiratory distress. She has no wheezes. She has no rales.  Abdominal: Soft. She exhibits no distension. There is no tenderness. There is no guarding.  Musculoskeletal: She exhibits no edema and no tenderness.       Thoracic back: She exhibits no tenderness and no bony tenderness.       Lumbar back: She exhibits tenderness and bony tenderness.  Lymphadenopathy:    She has no cervical adenopathy.  Neurological: She is alert and oriented to person, place, and time. She has normal strength. No sensory deficit. Coordination normal. GCS eye  subscore is 4. GCS verbal subscore is 5. GCS motor subscore is 6.  Reflex Scores:      Patellar reflexes are 2+ on the right side and 2+ on the left side. L5/large toe on right foot with poor extension, patient with surgical scars to this foot and unsure if chronic Strength otherwise 5/5  Skin: Skin is warm and dry. No rash noted. She is not diaphoretic. No erythema.    ED Course   Procedures (including critical care time)  Labs Reviewed - No data to display No results found. No diagnosis found.  MDM  62 year old  female with history of HIV presents after mechanical fall from standing in kitchen via EMS with lower back pain.  Patient cleared by NEXUS, without obvious intoxication, distracting injuries, neurologically intact, no midline tenderness. Patient without loss of bowel/bladder, neurologically intact.  No history of LOC, headache, anticogulant use, neck pain or other injuries.  XR lumbar spine done and shows no fracture.  Fentanyl given for pain.  Patient ambulated without issue prior to discharge and discharged in  Stable condition with rx for norco.  Discussed with Dr. Ethelda Chick.    Rhae Lerner, MD 06/23/13 0230  Rhae Lerner, MD 06/26/13 2005

## 2013-06-30 NOTE — ED Provider Notes (Signed)
I have personally seen and examined the patient.  I have discussed the plan of care with the resident.  I have reviewed the documentation on PMH/FH/Soc. History.  I have reviewed the documentation of the resident and agree.  Anetha Slagel, MD 06/30/13 1454 

## 2016-01-07 ENCOUNTER — Emergency Department (HOSPITAL_COMMUNITY): Payer: Medicare Other

## 2016-01-07 ENCOUNTER — Encounter (HOSPITAL_COMMUNITY): Payer: Self-pay | Admitting: Neurology

## 2016-01-07 ENCOUNTER — Emergency Department (HOSPITAL_COMMUNITY)
Admission: EM | Admit: 2016-01-07 | Discharge: 2016-01-07 | Disposition: A | Discharge status: Home / Self Care | Payer: Medicare Other | Attending: Emergency Medicine | Admitting: Emergency Medicine

## 2016-01-07 DIAGNOSIS — Z8659 Personal history of other mental and behavioral disorders: Secondary | ICD-10-CM | POA: Diagnosis not present

## 2016-01-07 DIAGNOSIS — B20 Human immunodeficiency virus [HIV] disease: Secondary | ICD-10-CM | POA: Diagnosis not present

## 2016-01-07 DIAGNOSIS — Y998 Other external cause status: Secondary | ICD-10-CM | POA: Insufficient documentation

## 2016-01-07 DIAGNOSIS — Y9389 Activity, other specified: Secondary | ICD-10-CM | POA: Insufficient documentation

## 2016-01-07 DIAGNOSIS — Y9289 Other specified places as the place of occurrence of the external cause: Secondary | ICD-10-CM | POA: Insufficient documentation

## 2016-01-07 DIAGNOSIS — I10 Essential (primary) hypertension: Secondary | ICD-10-CM | POA: Diagnosis not present

## 2016-01-07 DIAGNOSIS — W07XXXA Fall from chair, initial encounter: Secondary | ICD-10-CM | POA: Diagnosis not present

## 2016-01-07 DIAGNOSIS — S20211A Contusion of right front wall of thorax, initial encounter: Secondary | ICD-10-CM | POA: Diagnosis not present

## 2016-01-07 DIAGNOSIS — S3991XA Unspecified injury of abdomen, initial encounter: Secondary | ICD-10-CM | POA: Diagnosis not present

## 2016-01-07 DIAGNOSIS — F17210 Nicotine dependence, cigarettes, uncomplicated: Secondary | ICD-10-CM | POA: Diagnosis not present

## 2016-01-07 DIAGNOSIS — R1011 Right upper quadrant pain: Secondary | ICD-10-CM

## 2016-01-07 DIAGNOSIS — Z79899 Other long term (current) drug therapy: Secondary | ICD-10-CM | POA: Insufficient documentation

## 2016-01-07 DIAGNOSIS — S29001A Unspecified injury of muscle and tendon of front wall of thorax, initial encounter: Secondary | ICD-10-CM | POA: Diagnosis present

## 2016-01-07 LAB — COMPREHENSIVE METABOLIC PANEL
ALT: 18 U/L (ref 14–54)
ANION GAP: 13 (ref 5–15)
AST: 27 U/L (ref 15–41)
Albumin: 3.9 g/dL (ref 3.5–5.0)
Alkaline Phosphatase: 71 U/L (ref 38–126)
BUN: 13 mg/dL (ref 6–20)
CALCIUM: 10.3 mg/dL (ref 8.9–10.3)
CHLORIDE: 101 mmol/L (ref 101–111)
CO2: 27 mmol/L (ref 22–32)
Creatinine, Ser: 0.8 mg/dL (ref 0.44–1.00)
GFR calc non Af Amer: >60 mL/min (ref >60)
Glucose, Bld: 144 mg/dL — ABNORMAL HIGH (ref 65–99)
Potassium: 4 mmol/L (ref 3.5–5.1)
SODIUM: 141 mmol/L (ref 135–145)
Total Bilirubin: 0.5 mg/dL (ref 0.3–1.2)
Total Protein: 8.1 g/dL (ref 6.5–8.1)

## 2016-01-07 LAB — CBC
HCT: 40.4 % (ref 36.0–46.0)
HEMOGLOBIN: 13.1 g/dL (ref 12.0–15.0)
MCH: 30.5 pg (ref 26.0–34.0)
MCHC: 32.4 g/dL (ref 30.0–36.0)
MCV: 94 fL (ref 78.0–100.0)
Platelets: 200 10*3/uL (ref 150–400)
RBC: 4.3 MIL/uL (ref 3.87–5.11)
RDW: 14.6 % (ref 11.5–15.5)
WBC: 9.3 10*3/uL (ref 4.0–10.5)

## 2016-01-07 LAB — LIPASE, BLOOD: LIPASE: 33 U/L (ref 11–51)

## 2016-01-07 MED ORDER — HYDROCODONE-ACETAMINOPHEN 5-325 MG PO TABS: 1.0000 | ORAL_TABLET | Freq: Four times a day (QID) | ORAL | Status: AC | PRN

## 2016-01-07 MED ORDER — FENTANYL CITRATE (PF) 100 MCG/2ML IJ SOLN
100.0000 ug | Freq: Once | INTRAMUSCULAR | Status: AC
  Administered 2016-01-07: 100 ug via INTRAVENOUS
  Filled 2016-01-07: qty 2

## 2016-01-07 MED ORDER — IOHEXOL 300 MG/ML  SOLN
100.0000 mL | Freq: Once | INTRAMUSCULAR | Status: AC | PRN
  Administered 2016-01-07: 100 mL via INTRAVENOUS

## 2016-01-07 MED ORDER — METHOCARBAMOL 500 MG PO TABS: 500.0000 mg | ORAL_TABLET | Freq: Two times a day (BID) | ORAL | Status: AC

## 2016-01-07 NOTE — Discharge Instructions (Signed)
1. Medications: Vicodin for severe pain, Robaxin as a muscle relaxer, usual home medications 2. Treatment: rest, drink plenty of fluids, stretching, heat and ice 3. Follow Up: Please followup with your primary doctor in 7-10 days for discussion of your diagnoses and further evaluation after today's visit; if you do not have a primary care doctor use the resource guide provided to find one; Please return to the ER for difficulty breathing, high fevers, cough, other worsening symptoms.   Blunt Chest Trauma Blunt chest trauma is an injury caused by a blow to the chest. These chest injuries can be very painful. Blunt chest trauma often results in bruised or broken (fractured) ribs. Most cases of bruised and fractured ribs from blunt chest traumas get better after 1 to 3 weeks of rest and pain medicine. Often, the soft tissue in the chest wall is also injured, causing pain and bruising. Internal organs, such as the heart and lungs, may also be injured. Blunt chest trauma can lead to serious medical problems. This injury requires immediate medical care. CAUSES   Motor vehicle collisions.  Falls.  Physical violence.  Sports injuries. SYMPTOMS   Chest pain. The pain may be worse when you move or breathe deeply.  Shortness of breath.  Lightheadedness.  Bruising.  Tenderness.  Swelling. DIAGNOSIS  Your caregiver will do a physical exam. X-rays may be taken to look for fractures. However, minor rib fractures may not show up on X-rays until a few days after the injury. If a more serious injury is suspected, further imaging tests may be done. This may include ultrasounds, computed tomography (CT) scans, or magnetic resonance imaging (MRI). TREATMENT  Treatment depends on the severity of your injury. Your caregiver may prescribe pain medicines and deep breathing exercises. HOME CARE INSTRUCTIONS  Limit your activities until you can move around without much pain.  Do not do any strenuous work  until your injury is healed.  Put ice on the injured area.  Put ice in a plastic bag.  Place a towel between your skin and the bag.  Leave the ice on for 15-20 minutes, 03-04 times a day.  You may wear a rib belt as directed by your caregiver to reduce pain.  Practice deep breathing as directed by your caregiver to keep your lungs clear.  Only take over-the-counter or prescription medicines for pain, fever, or discomfort as directed by your caregiver. SEEK IMMEDIATE MEDICAL CARE IF:   You have increasing pain or shortness of breath.  You cough up blood.  You have nausea, vomiting, or abdominal pain.  You have a fever.  You feel dizzy, weak, or you faint. MAKE SURE YOU:  Understand these instructions.  Will watch your condition.  Will get help right away if you are not doing well or get worse.   This information is not intended to replace advice given to you by your health care provider. Make sure you discuss any questions you have with your health care provider.   Document Released: 12/17/2004 Document Revised: 11/30/2014 Document Reviewed: 05/08/2015 Elsevier Interactive Patient Education [employer]

## 2016-01-07 NOTE — ED Notes (Signed)
Pt c/o fall on cabinets Sunday. C/o pain with cough and streaks of blood in vomitus.

## 2016-01-07 NOTE — ED Notes (Signed)
Pt reports Sunday was standing on a chair, fell and landed on her cabinet hitting her RUQ. This morning she was eating a bagel and vomited x 1, reports there was blood in her vomit. No bruising to abdomen noted. Pt is a x 4, is tearful in triage.

## 2016-01-07 NOTE — ED Notes (Signed)
Pt ambulates to BR with steady gait.

## 2016-01-07 NOTE — ED Notes (Signed)
Pt verbalizes understanding of instructions. 

## 2016-01-07 NOTE — ED Provider Notes (Signed)
CSN: [id number]     Arrival date & time 01/07/16  1046 History   First MD Initiated Contact with Patient 01/07/16 1150     No chief complaint on file.    (Consider location/radiation/quality/duration/timing/severity/associated sxs/prior Treatment) The history is provided by the patient and medical records. No language interpreter was used.     Jenna Camacho is a 65 y.o. female  with a hx of HIV, anxiety, depression, HTN presents to the Emergency Department complaining of gradual, persistent, progressively worsening right-sided chest and abdominal pain onset 10 days ago after fall. Patient reports she was standing on a chair when she fell hitting her right upper quadrant and right chest on her countertop and cabinet. She denies hitting her head, consciousness or back pain. Patient reports she was immediately ambulatory. She states that she has been taking Tylenol for her pain but it has continued to worsen over the last few days. She has associated abdominal distention. Patient reports that today while eating breakfast she became nauseated and vomited. She reports that she had specks of blood in her emesis. She denies bilious emesis. Patient reports that nothing really makes her symptoms better but laughing, coughing and vomiting make her pain worse. She also reports stated slight shortness of breath. He denies fever, chills, headache, neck pain, diarrhea, weakness, dizziness, syncope, dysuria, hematuria. She reports she is not anticoagulated.  Care everywhere shows the patient is followed by [facility].  Her last evaluation was on 11/05/2015. Her HIV was undetectable and her CD4 count was 1830.  Patient is taking Genvoya.    Past Medical History  Diagnosis Date  . HIV (human immunodeficiency virus infection) (HCC)   . Anxiety   . Depression   . Hypertension    History reviewed. No pertinent past surgical history. No family history on file. Social History    Substance Use Topics  . Smoking status: Smoker, Current Status Unknown    Types: Cigarettes  . Smokeless tobacco: None  . Alcohol Use: No   OB History    No data available     Review of Systems  Constitutional: Negative for fever, diaphoresis, appetite change, fatigue and unexpected weight change.  HENT: Negative for mouth sores.   Eyes: Negative for visual disturbance.  Respiratory: Negative for cough, chest tightness, shortness of breath and wheezing.   Cardiovascular: Positive for chest pain.  Gastrointestinal: Positive for nausea, vomiting, abdominal pain and abdominal distention. Negative for diarrhea and constipation.  Endocrine: Negative for polydipsia, polyphagia and polyuria.  Genitourinary: Negative for dysuria, urgency, frequency and hematuria.  Musculoskeletal: Negative for back pain and neck stiffness.  Skin: Negative for rash.  Allergic/Immunologic: Negative for immunocompromised state.  Neurological: Negative for syncope, light-headedness and headaches.  Hematological: Does not bruise/bleed easily.  Psychiatric/Behavioral: Negative for sleep disturbance. The patient is not nervous/anxious.       Allergies  Review of patient's allergies indicates no known allergies.  Home Medications   Prior to Admission medications   Medication Sig Start Date End Date Taking? Authorizing Provider  Cyanocobalamin (VITAMIN B-12 PO) Take 1 tablet by mouth daily.    Historical Provider, MD  hydrochlorothiazide (HYDRODIURIL) 25 MG tablet Take 25 mg by mouth daily.    Historical Provider, MD  HYDROcodone-acetaminophen (NORCO/VICODIN) 5-325 MG tablet Take 1-2 tablets by mouth every 6 (six) hours as needed for moderate pain or severe pain. 01/07/16   Sherrick Araki, PA-C  methocarbamol (ROBAXIN) 500 MG tablet Take 1 tablet (500 mg total) by  mouth 2 (two) times daily. 01/07/16   Deondrick Searls, PA-C   BP 164/81 mmHg  Pulse 68  Temp(Src) 98.7 F (37.1 C) (Oral)  Resp 18   SpO2 100% Physical Exam  Constitutional: She appears well-developed and well-nourished. No distress.  Awake, alert, nontoxic appearance  HENT:  Head: Normocephalic and atraumatic.  Mouth/Throat: Oropharynx is clear and moist. No oropharyngeal exudate.  Eyes: Conjunctivae are normal. No scleral icterus.  Neck: Normal range of motion. Neck supple.  Full ROM without pain  Cardiovascular: Normal rate, regular rhythm, normal heart sounds and intact distal pulses.   Pulses:      Radial pulses are 2+ on the right side, and 2+ on the left side.  Pulmonary/Chest: Effort normal. No accessory muscle usage. No tachypnea. No respiratory distress. She has decreased breath sounds in the right lower field and the left lower field. She has no wheezes. She exhibits tenderness. She exhibits no laceration, no crepitus, no deformity and no retraction.    Equal chest expansion Clear and equal breath sounds, diminished in the bases Significant tenderness to palpation along the right lower ribs without palpable deformity  Abdominal: Soft. Bowel sounds are normal. She exhibits distension. She exhibits no mass. There is tenderness in the right upper quadrant and epigastric area. There is no rebound, no guarding and no CVA tenderness.    Musculoskeletal: Normal range of motion. She exhibits no edema.  Full range of motion of the T-spine and L-spine No tenderness to palpation of the spinous processes of the T-spine or L-spine Tenderness to palpation of the paraspinous muscles of the right t-spine  Lymphadenopathy:    She has no cervical adenopathy.  Neurological: She is alert. She has normal reflexes.  Reflex Scores:      Bicep reflexes are 2+ on the right side and 2+ on the left side.      Brachioradialis reflexes are 2+ on the right side and 2+ on the left side.      Patellar reflexes are 2+ on the right side and 2+ on the left side.      Achilles reflexes are 2+ on the right side and 2+ on the left  side. Speech is clear and goal oriented, follows commands Normal 5/5 strength in upper and lower extremities bilaterally including dorsiflexion and plantar flexion, strong and equal grip strength Sensation normal to light and sharp touch Moves extremities without ataxia, coordination intact Normal gait Normal balance No Clonus   Skin: Skin is warm and dry. No rash noted. She is not diaphoretic. No erythema.  Psychiatric: She has a normal mood and affect. Her behavior is normal.  Nursing note and vitals reviewed.   ED Course  Procedures (including critical care time) Labs Review Labs Reviewed  COMPREHENSIVE METABOLIC PANEL - Abnormal; Notable for the following:    Glucose, Bld 144 (*)    All other components within normal limits  LIPASE, BLOOD  CBC  URINALYSIS, ROUTINE W REFLEX MICROSCOPIC (NOT AT [facility])    Imaging Review Dg Ribs Unilateral W/chest Right  01/07/2016  CLINICAL DATA:  Acute right upper quadrant abdominal pain after fall off chair 9 days ago. EXAM: RIGHT RIBS AND CHEST - 3+ VIEW COMPARISON:  June 01, 2011. FINDINGS: No fracture or other bone lesions are seen involving the ribs. There is no evidence of pneumothorax or pleural effusion. Both lungs are clear. Heart size and mediastinal contours are within normal limits. IMPRESSION: Normal right ribs.  No acute cardiopulmonary abnormality seen. Electronically Signed  By: Lupita Raider, M.D.   On: 01/07/2016 12:54   Ct Abdomen Pelvis W Contrast  01/07/2016  CLINICAL DATA:  Fall from chair 10 days ago with persistent right-sided abdominal pain EXAM: CT ABDOMEN AND PELVIS WITH CONTRAST TECHNIQUE: Multidetector CT imaging of the abdomen and pelvis was performed using the standard protocol following bolus administration of intravenous contrast. CONTRAST:  OMNIPAQUE IOHEXOL 300 MG/ML  SOLN COMPARISON:  None. FINDINGS: Lung bases are free of acute infiltrate or sizable effusion. The liver, gallbladder, spleen, adrenal glands  and pancreas are within normal limits. The kidneys are well visualized bilaterally no renal calculi or obstructive changes are seen. Delayed images demonstrate normal enhancement and excretion. The appendix is within normal limits. Calcified uterine fibroids are seen. The bladder is partially distended. No pelvic mass lesion is seen. No free abdominal or pelvic fluid is noted. Aortoiliac calcifications are seen without aneurysmal dilatation. The osseous structures show no acute abnormality. IMPRESSION: No acute abnormality noted. Electronically Signed   By: Alcide Clever M.D.   On: 01/07/2016 13:25   I have personally reviewed and evaluated these images and lab results as part of my medical decision-making.   EKG Interpretation None      MDM   Final diagnoses:  Rib contusion, right, initial encounter  RUQ abdominal pain  Accidental fall from chair, initial encounter   Jenna Camacho presents with right-sided rib pain and upper abdominal pain 10 days status post fall from a chair striking her right side on a countertop. Pain is well controlled here in the emergency department. Tachycardia has resolved. No hypotension. Patient is afebrile with clear and equal breath sounds. Abdomen is without guarding or rebound.  Plain films of chest show no rib fractures. CT scan of abdomen shows no free abdominal pelvic fluid.  A shunt feels much better and requests discharge home. Strict follow-up with her primary care doctor.  BP 143/40 mmHg  Pulse 77  Temp(Src) 98.7 F (37.1 C) (Oral)  Resp 18  SpO2 99%     BP 143/40 mmHg  Pulse 77  Temp(Src) 98.7 F (37.1 C) (Oral)  Resp 18  SpO2 99%    Dierdre Forth, PA-C 01/07/16 1426  Azalia Bilis, MD 01/07/16 [phone]

## 2016-08-19 ENCOUNTER — Ambulatory Visit (INDEPENDENT_AMBULATORY_CARE_PROVIDER_SITE_OTHER): Payer: Medicare Other

## 2016-08-19 ENCOUNTER — Ambulatory Visit (HOSPITAL_COMMUNITY)
Admission: EM | Admit: 2016-08-19 | Discharge: 2016-08-19 | Disposition: A | Discharge status: Home / Self Care | Payer: Medicare Other | Attending: Internal Medicine | Admitting: Internal Medicine

## 2016-08-19 ENCOUNTER — Encounter (HOSPITAL_COMMUNITY): Payer: Self-pay | Admitting: Emergency Medicine

## 2016-08-19 DIAGNOSIS — S92902A Unspecified fracture of left foot, initial encounter for closed fracture: Secondary | ICD-10-CM

## 2016-08-19 MED ORDER — OXYCODONE-ACETAMINOPHEN 10-325 MG PO TABS: 1.0000 | ORAL_TABLET | ORAL | 0 refills | Status: AC | PRN

## 2016-08-19 NOTE — ED Triage Notes (Signed)
[facility] a [facility] left foot pain secondary to dropping a large coffee mug on her foot 1 week ago.

## 2016-08-19 NOTE — ED Provider Notes (Signed)
CSN: [id number]     Arrival date & time 08/19/16  1250 History   First MD Initiated Contact with Patient 08/19/16 1405     Chief Complaint  Patient presents with  . Foot Pain   (Consider location/radiation/quality/duration/timing/severity/associated sxs/prior Treatment) HPI 65 y/o female dropped coffee mug onto left foot Monday. Home treatment limited. Accident happened in [place]. Was at funeral. Waited to get back to [place] to be seen.   Past Medical History:  Diagnosis Date  . Anxiety   . Depression   . HIV (human immunodeficiency virus infection) (HCC)   . Hypertension    History reviewed. No pertinent surgical history. History reviewed. No pertinent family history. Social History  Substance Use Topics  . Smoking status: Smoker, Current Status Unknown    Types: Cigarettes  . Smokeless tobacco: Never Used  . Alcohol use No   OB History    No data available     Review of Systems  Denies: HEADACHE, NAUSEA, ABDOMINAL PAIN, CHEST PAIN, CONGESTION, DYSURIA, SHORTNESS OF BREATH  Allergies  Review of patient's allergies indicates no known allergies.  Home Medications   Prior to Admission medications   Medication Sig Start Date End Date Taking? Authorizing Provider  hydrochlorothiazide (HYDRODIURIL) 25 MG tablet Take 25 mg by mouth daily.   Yes Historical Provider, MD  Cyanocobalamin (VITAMIN B-12 PO) Take 1 tablet by mouth daily.    Historical Provider, MD  HYDROcodone-acetaminophen (NORCO/VICODIN) 5-325 MG tablet Take 1-2 tablets by mouth every 6 (six) hours as needed for moderate pain or severe pain. 01/07/16   Hannah Muthersbaugh, PA-C  methocarbamol (ROBAXIN) 500 MG tablet Take 1 tablet (500 mg total) by mouth 2 (two) times daily. 01/07/16   Hannah Muthersbaugh, PA-C   Meds Ordered and Administered this Visit  Medications - No data to display  BP 138/89 (BP Location: Left Arm)   Pulse 98   Temp 98.4 F (36.9 C) (Oral)   Resp 18   SpO2 97%  No data found.   Physical  Exam NURSES NOTES AND VITAL SIGNS REVIEWED. CONSTITUTIONAL: Well developed, well nourished, no acute distress HEENT: normocephalic, atraumatic EYES: Conjunctiva normal NECK:normal ROM, supple, no adenopathy PULMONARY:No respiratory distress, normal effort ABDOMINAL: Soft, ND, NT BS+, No CVAT MUSCULOSKELETAL: Normal ROM of all extremities, left foot 4th toe bruised tender difficulty moving toes.  SKIN: warm and dry without rash PSYCHIATRIC: Mood and affect, behavior are normal  Urgent Care Course   Clinical Course   Cam walker, analgesia, follow up with podiatry Procedures (including critical care time)  Labs Review Labs Reviewed - No data to display  Imaging Review Dg Foot Complete Left  Result Date: 08/19/2016 CLINICAL DATA:  Foot pain, dropped a coffee mild on the left foot 1 week ago. Prior ankle surgery. EXAM: LEFT FOOT - COMPLETE 3+ VIEW COMPARISON:  None. FINDINGS: Lag screw fixation of the medial malleolus and plate and screw fixation of the distal fibula observed. Transverse lucency in the middle phalanx of the fourth and fifth toes, suspicious for fracture. No malalignment at the Lisfranc joint. Questionable deformity in the midshaft of the proximal phalanx third digit is thought to likely be incidental. Plantar calcaneal spur. IMPRESSION: 1. Transverse lucency suspicious for fractures through the middle phalanges of the fourth and fifth toes. 2. Postoperative findings in the ankle. Electronically Signed   By: Gaylyn Rong M.D.   On: 08/19/2016 14:49  Discussed with patient prior to discharge   Visual Acuity Review  Right Eye Distance:   Left  Eye Distance:   Bilateral Distance:    Right Eye Near:   Left Eye Near:    Bilateral Near:         MDM   1. Foot fracture, left, closed, initial encounter     Patient is reassured that there are no issues that require transfer to higher level of care at this time or additional tests. Patient is advised to continue  home symptomatic treatment. Patient is advised that if there are new or worsening symptoms to attend the emergency department, contact primary care provider, or return to UC. Instructions of care provided discharged home in stable condition.    THIS NOTE WAS GENERATED USING A VOICE RECOGNITION SOFTWARE PROGRAM. ALL REASONABLE EFFORTS  WERE MADE TO PROOFREAD THIS DOCUMENT FOR ACCURACY.  I have verbally reviewed the discharge instructions with the patient. A printed AVS was given to the patient.  All questions were answered prior to discharge.      Tharon AquasFrank C Yesena Reaves, PA 08/19/16 1550

## 2016-08-28 ENCOUNTER — Encounter: Payer: Self-pay | Admitting: Podiatry

## 2016-08-28 ENCOUNTER — Ambulatory Visit (INDEPENDENT_AMBULATORY_CARE_PROVIDER_SITE_OTHER): Payer: Medicare Other | Admitting: Podiatry

## 2016-08-28 VITALS — BP 97/65 | HR 85 | Resp 16 | Ht 64.0 in | Wt 123.0 lb

## 2016-08-28 DIAGNOSIS — S92533A Displaced fracture of distal phalanx of unspecified lesser toe(s), initial encounter for closed fracture: Secondary | ICD-10-CM | POA: Diagnosis not present

## 2016-08-28 NOTE — Progress Notes (Signed)
   Subjective:    Patient ID: Jenna Camacho, female    DOB: 03-08-1951, 65 y.o.   MRN: [id number]  HPI Chief Complaint  Patient presents with  . Toe Pain    Left 2nd, 3rd and 4th toes.  States she dropped a coffee cup on them and went to Urgent Care about 10 days later.        Review of Systems     Objective:   Physical Exam        Assessment & Plan:

## 2016-08-30 NOTE — Progress Notes (Signed)
Subjective:     Patient ID: Jenna Camacho, female   DOB: [birth year], 65 y.o.   MRN: [id number]  HPI patient presents stating that the doctor told her she had 3 broken toes on her left foot and she's wearing the boot and presents with her x-ray   Review of Systems  All other systems reviewed and are negative.      Objective:   Physical Exam  Constitutional: She is oriented to person, place, and time.  Cardiovascular: Intact distal pulses.   Musculoskeletal: Normal range of motion.  Neurological: She is oriented to person, place, and time.  Skin: Skin is warm.  Nursing note and vitals reviewed.  neurovascular status intact muscle strength adequate with swelling of the second third and fourth toes left with the fourth toe hurting worse upon palpation. Patient's found have good digital perfusion and is well oriented 3     Assessment:     Probable fracture lesser digits left    Plan:     H&P x-rays reviewed and at this time recommended shoe gear with immobilization which will be easier than boot with boot to be used if the pain intensifies. Patient does have fracture obvious of the fourth toe left with possibility of fracture of second and third  X-ray report indicates distal fracture of the second digit left with possibility of fracture third and fourth toes

## 2016-09-10 ENCOUNTER — Emergency Department (HOSPITAL_COMMUNITY): Payer: Medicare Other

## 2016-09-10 ENCOUNTER — Emergency Department (HOSPITAL_COMMUNITY)
Admission: EM | Admit: 2016-09-10 | Discharge: 2016-09-11 | Disposition: A | Discharge status: Home / Self Care | Payer: Medicare Other | Attending: Emergency Medicine | Admitting: Emergency Medicine

## 2016-09-10 ENCOUNTER — Encounter (HOSPITAL_COMMUNITY): Payer: Self-pay | Admitting: Emergency Medicine

## 2016-09-10 DIAGNOSIS — R932 Abnormal findings on diagnostic imaging of liver and biliary tract: Secondary | ICD-10-CM | POA: Diagnosis not present

## 2016-09-10 DIAGNOSIS — R52 Pain, unspecified: Secondary | ICD-10-CM

## 2016-09-10 DIAGNOSIS — Z79899 Other long term (current) drug therapy: Secondary | ICD-10-CM | POA: Insufficient documentation

## 2016-09-10 DIAGNOSIS — I10 Essential (primary) hypertension: Secondary | ICD-10-CM | POA: Diagnosis not present

## 2016-09-10 DIAGNOSIS — R1011 Right upper quadrant pain: Secondary | ICD-10-CM | POA: Diagnosis not present

## 2016-09-10 DIAGNOSIS — F17210 Nicotine dependence, cigarettes, uncomplicated: Secondary | ICD-10-CM | POA: Insufficient documentation

## 2016-09-10 DIAGNOSIS — R079 Chest pain, unspecified: Secondary | ICD-10-CM | POA: Diagnosis present

## 2016-09-10 LAB — URINALYSIS, ROUTINE W REFLEX MICROSCOPIC
Bilirubin Urine: NEGATIVE
GLUCOSE, UA: NEGATIVE mg/dL
HGB URINE DIPSTICK: NEGATIVE
KETONES UR: NEGATIVE mg/dL
Nitrite: NEGATIVE
PROTEIN: NEGATIVE mg/dL
Specific Gravity, Urine: 1.013 (ref 1.005–1.030)
pH: 7.5 (ref 5.0–8.0)

## 2016-09-10 LAB — HEPATIC FUNCTION PANEL
ALT: 23 U/L (ref 14–54)
AST: 82 U/L — ABNORMAL HIGH (ref 15–41)
Albumin: 3.5 g/dL (ref 3.5–5.0)
Alkaline Phosphatase: 118 U/L (ref 38–126)
Bilirubin, Direct: 0.2 mg/dL (ref 0.1–0.5)
Indirect Bilirubin: 0.3 mg/dL (ref 0.3–0.9)
Total Bilirubin: 0.5 mg/dL (ref 0.3–1.2)
Total Protein: 8.3 g/dL — ABNORMAL HIGH (ref 6.5–8.1)

## 2016-09-10 LAB — LIPASE, BLOOD: Lipase: 21 U/L (ref 11–51)

## 2016-09-10 LAB — I-STAT TROPONIN, ED
TROPONIN I, POC: 0 ng/mL (ref 0.00–0.08)
Troponin i, poc: 0 ng/mL (ref 0.00–0.08)

## 2016-09-10 LAB — BASIC METABOLIC PANEL
ANION GAP: 12 (ref 5–15)
BUN: 13 mg/dL (ref 6–20)
CALCIUM: 9.6 mg/dL (ref 8.9–10.3)
CO2: 23 mmol/L (ref 22–32)
Chloride: 101 mmol/L (ref 101–111)
Creatinine, Ser: 0.75 mg/dL (ref 0.44–1.00)
GFR calc Af Amer: >60 mL/min (ref >60)
GLUCOSE: 75 mg/dL (ref 65–99)
POTASSIUM: 4.4 mmol/L (ref 3.5–5.1)
SODIUM: 136 mmol/L (ref 135–145)

## 2016-09-10 LAB — CBC
HEMATOCRIT: 34.6 % — AB (ref 36.0–46.0)
HEMOGLOBIN: 11.4 g/dL — AB (ref 12.0–15.0)
MCH: 29.5 pg (ref 26.0–34.0)
MCHC: 32.9 g/dL (ref 30.0–36.0)
MCV: 89.6 fL (ref 78.0–100.0)
Platelets: 168 10*3/uL (ref 150–400)
RBC: 3.86 MIL/uL — ABNORMAL LOW (ref 3.87–5.11)
RDW: 14.5 % (ref 11.5–15.5)
WBC: 9.4 10*3/uL (ref 4.0–10.5)

## 2016-09-10 LAB — URINE MICROSCOPIC-ADD ON: RBC / HPF: NONE SEEN RBC/hpf (ref 0–5)

## 2016-09-10 MED ORDER — HYDROMORPHONE HCL 2 MG/ML IJ SOLN
1.0000 mg | Freq: Once | INTRAMUSCULAR | Status: AC
  Administered 2016-09-10: 1 mg via INTRAMUSCULAR
  Filled 2016-09-10: qty 1

## 2016-09-10 MED ORDER — IOPAMIDOL (ISOVUE-370) INJECTION 76%
INTRAVENOUS | Status: AC
  Administered 2016-09-10: 100 mL
  Filled 2016-09-10: qty 100

## 2016-09-10 MED ORDER — TRAMADOL HCL 50 MG PO TABS: 50.0000 mg | ORAL_TABLET | Freq: Four times a day (QID) | ORAL | 0 refills | Status: AC | PRN

## 2016-09-10 MED ORDER — IOPAMIDOL (ISOVUE-370) INJECTION 76%
INTRAVENOUS | Status: AC
  Filled 2016-09-10: qty 100

## 2016-09-10 NOTE — Discharge Instructions (Signed)
Please follow-up with your surgeon and get your MRI on the 25th of this month to further evaluate the abnormality we see in your liver.  Please follow with your primary care doctor in the next 2 days for a check-up. They must obtain records for further management.   Do not hesitate to return to the Emergency Department for any new, worsening or concerning symptoms.

## 2016-09-10 NOTE — Progress Notes (Signed)
While rounding in ED I was ask to visit with patient per patient request. Patient overwhelmed with caring for everyone except self. Patient indicated that she was having SOB and some pain in her rightside . Patient is very tearful and just needed someone to listen to her and affirm that what she has done for other is good but that she also need to care for self.  I prayed with her and provide spiritual and emotional guidance.  Patient said she felt better emotionally after visit.  Patient is working very hard to remain positive about possible outcome. Patient has very little support other than a nephew who is at bedside.   09/10/16 1600  Clinical Encounter Type  Visited With Patient;Patient and family together;Health care provider  Visit Type Initial;Spiritual support;ED  Referral From Nurse  Spiritual Encounters  Spiritual Needs Prayer;Emotional  Stress Factors  Patient Stress Factors Exhausted;Health changes  Venida JarvisWatlington, Brallan Denio, VirginiaChaplain,pager [phone]

## 2016-09-10 NOTE — ED Notes (Signed)
Notified Ray,Chaplin

## 2016-09-10 NOTE — ED Notes (Signed)
Placed patient into a gown and on the monitor 

## 2016-09-10 NOTE — ED Notes (Signed)
Patient back from CT and CT tech states IV has knot above line. Unable to do scan. Patient states it is painful and wants in removed. EDP aware.

## 2016-09-10 NOTE — ED Notes (Signed)
Pt asking to go back now--

## 2016-09-10 NOTE — ED Provider Notes (Signed)
[facility]-EMERGENCY DEPT Provider Note   CSN: [id number] Arrival date & time: 09/10/16  1238     History   Chief Complaint Chief Complaint  Patient presents with  . Chest Pain  . Abdominal Pain    HPI   Blood pressure 113/55, pulse 89, temperature 98.2 F (36.8 C), temperature source Oral, resp. rate 18, height 5\' 4"  (1.626 m), weight 55.8 kg, SpO2 97 %.  Jenna Camacho is a 65 y.o. female with past medical history significant for hypertension, depression,  HIV (followed at [facility] and viral load undetectable As per patient) complaining of right-sided chest pain onset yesterday with associated dry cough, she then developed right-sided abdominal pain that is significant, 10 out of 10, not associated with any melena, hematochezia. 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 She denies increasing calf pain, leg swelling, history of DVT/PE, hemoptysis, shortness of breath, syncope, palpitations.    Past Medical History:  Diagnosis Date  . Anxiety   . Depression   . HIV (human immunodeficiency virus infection) (HCC)   . Hypertension     Patient Active Problem List   Diagnosis Date Noted  . ESOPHAGEAL REFLUX 03/06/2010  . OTHER CONSTIPATION 03/06/2010  . ABDOMINAL PAIN, EPIGASTRIC 03/06/2010    History reviewed. No pertinent surgical history.  OB History    No data available       Home Medications    Prior to Admission medications   Medication Sig Start Date End Date Taking? Authorizing Provider  benzonatate (TESSALON) 100 MG capsule Take 100 mg by mouth 3 (three) times daily as needed for cough.   Yes Historical Provider, MD  Cyanocobalamin (VITAMIN B-12 PO) Take 1 tablet by mouth daily.   Yes Historical Provider, MD  elvitegravir-cobicistat-emtricitabine-tenofovir (GENVOYA) 150-150-200-10 MG TABS tablet Take 1 tablet by mouth daily with breakfast.   Yes Historical Provider, MD  hydrochlorothiazide (HYDRODIURIL) 25 MG tablet  Take 25 mg by mouth daily.   Yes Historical Provider, MD  oxyCODONE-acetaminophen (PERCOCET) 10-325 MG tablet Take 1 tablet by mouth every 4 (four) hours as needed for pain.   Yes Historical Provider, MD  potassium chloride (K-DUR,KLOR-CON) 10 MEQ tablet Take 10 mEq by mouth daily.   Yes Historical Provider, MD  HYDROcodone-acetaminophen (NORCO/VICODIN) 5-325 MG tablet Take 1-2 tablets by mouth every 6 (six) hours as needed for moderate pain or severe pain. Patient not taking: Reported on 09/10/2016 01/07/16   Dahlia Client Muthersbaugh, PA-C  methocarbamol (ROBAXIN) 500 MG tablet Take 1 tablet (500 mg total) by mouth 2 (two) times daily. Patient not taking: Reported on 09/10/2016 01/07/16   Dahlia Client Muthersbaugh, PA-C  oxyCODONE-acetaminophen (PERCOCET) 10-325 MG tablet Take 1 tablet by mouth every 4 (four) hours as needed for pain. 08/19/16   Tharon Aquas, PA  traMADol (ULTRAM) 50 MG tablet Take 1 tablet (50 mg total) by mouth every 6 (six) hours as needed. 09/10/16   Joni Reining Nettie Wyffels, PA-C    Family History No family history on file.  Social History Social History  Substance Use Topics  . Smoking status: Smoker, Current Status Unknown    Types: Cigarettes  . Smokeless tobacco: Never Used  . Alcohol use No     Allergies   Review of patient's allergies indicates no known allergies.   Review of Systems Review of Systems  10 systems reviewed and found to be negative, except as noted in the HPI.   Physical Exam Updated Vital Signs BP 111/71   Pulse 79  Temp 98.2 F (36.8 C) (Oral)   Resp 14   Ht 5\' 4"  (1.626 m)   Wt 55.8 kg   SpO2 99%   BMI 21.11 kg/m   Physical Exam  Constitutional: She is oriented to person, place, and time. She appears well-developed and well-nourished. No distress.  HENT:  Head: Normocephalic and atraumatic.  Mouth/Throat: Oropharynx is clear and moist.  Eyes: Conjunctivae and EOM are normal. Pupils are equal, round, and reactive to light.  Neck: Normal  range of motion. No JVD present. No tracheal deviation present.  Cardiovascular: Normal rate, regular rhythm and intact distal pulses.   Radial pulse equal bilaterally  Pulmonary/Chest: Effort normal and breath sounds normal. No stridor. No respiratory distress. She has no wheezes. She has no rales. She exhibits no tenderness.  Abdominal: Soft. She exhibits no distension and no mass. There is tenderness. There is no rebound and no guarding.  Active bowel sounds, exquisitely tender to palpation in the right upper quadrant with no guarding or rebound.  Musculoskeletal: Normal range of motion. She exhibits no edema or tenderness.  No calf asymmetry, superficial collaterals, palpable cords, edema, Homans sign negative bilaterally.    Neurological: She is alert and oriented to person, place, and time.  Skin: Skin is warm. Capillary refill takes less than 2 seconds. She is not diaphoretic.  Psychiatric: She has a normal mood and affect.  Labile mood, intermittently tearful, does not appear to be responding to internal stimuli. Patient denies suicidal ideation, homicidal ideation, auditory or visual hallucinations.  Nursing note and vitals reviewed.    ED Treatments / Results  Labs (all labs ordered are listed, but only abnormal results are displayed) Labs Reviewed  CBC - Abnormal; Notable for the following:       Result Value   RBC 3.86 (*)    Hemoglobin 11.4 (*)    HCT 34.6 (*)    All other components within normal limits  URINALYSIS, ROUTINE W REFLEX MICROSCOPIC (NOT [facility]) - Abnormal; Notable for the following:    Leukocytes, UA TRACE (*)    All other components within normal limits  HEPATIC FUNCTION PANEL - Abnormal; Notable for the following:    Total Protein 8.3 (*)    AST 82 (*)    All other components within normal limits  URINE MICROSCOPIC-ADD ON - Abnormal; Notable for the following:    Squamous Epithelial / LPF 0-5 (*)    Bacteria, UA RARE (*)    All other components  within normal limits  BASIC METABOLIC PANEL  LIPASE, BLOOD  I-STAT TROPOININ, ED  I-STAT TROPOININ, ED  Rosezena SensorI-STAT TROPOININ, ED    EKG  EKG Interpretation  Date/Time:  Thursday September 10 2016 22:36:06 EDT Ventricular Rate:  80 PR Interval:  156 QRS Duration: 92 QT Interval:  394 QTC Calculation: 455 R Axis:   68 Text Interpretation:  Sinus rhythm Borderline repolarization abnormality No significant change was found Confirmed by Manus GunningANCOUR  MD, STEPHEN [phone]([id number]) on 09/10/2016 10:49:18 PM       Radiology Dg Chest 2 View  Result Date: 09/10/2016 CLINICAL DATA:  Acute onset of cough.  Initial encounter. EXAM: CHEST  2 VIEW COMPARISON:  Chest radiograph performed 01/07/2016 FINDINGS: The lungs are well-aerated. Mild peribronchial thickening is noted. There is no evidence of focal opacification, pleural effusion or pneumothorax. The heart is normal in size; the mediastinal contour is within normal limits. No acute osseous abnormalities are seen. IMPRESSION: Mild peribronchial thickening noted.  Lungs otherwise grossly clear. Electronically  Signed   By: Roanna Raider M.D.   On: 09/10/2016 18:37   US Abdomen Limited Ruq  Result Date: 09/10/2016 CLINICAL DATA:  Initial valuation for acute abdominal pain. EXAM: US ABDOMEN LIMITED - RIGHT UPPER QUADRANT COMPARISON:  Prior CT from 01/07/2016. FINDINGS: Gallbladder: No gallstones or wall thickening visualized. No sonographic Murphy sign noted by sonographer. Common bile duct: Diameter: 3.4 mm Liver: Heterogeneous echotexture with nodular contour, suggestive of cirrhosis. There is an apparent 5.0 x 3.3 x 4.8 cm more focal lesion with increased echogenicity within the posterior right hepatic lobe. Unclear whether this reflects a focal lesion or possibly geographic fat. No increased vascularity. Portal vein is patent. IMPRESSION: 1. Heterogeneous echotexture of the liver with nodular contour, compatible with cirrhosis. 2. 5.0 x 3.3 x 4.8 cm echogenic  area/lesion within the posterior right hepatic lobe. Unclear whether this reflects a true lesion or possibly geographic fat. Further evaluation with dedicated MRI is recommended for further delineation, particularly given the presence of morphologic changes compatible with cirrhosis. 3. Normal sonographic appearance of the gallbladder. 4. No biliary dilatation. Electronically Signed   By: Rise Mu M.D.   On: 09/10/2016 21:46    Procedures Procedures (including critical care time)  Medications Ordered in ED Medications  iopamidol (ISOVUE-370) 76 % injection (not administered)  iopamidol (ISOVUE-370) 76 % injection (100 mLs  Contrast Given 09/10/16 1921)  HYDROmorphone (DILAUDID) injection 1 mg (1 mg Intramuscular Given 09/10/16 2147)     Initial Impression / Assessment and Plan / ED Course  I have reviewed the triage vital signs and the nursing notes.  Pertinent labs & imaging results that were available during my care of the patient were reviewed by me and considered in my medical decision making (see chart for details).  Clinical Course     Vitals:   09/10/16 2235 09/10/16 2236 09/10/16 2330 09/10/16 2331  BP: 119/65  112/65 111/71  Pulse:  81 76 79  Resp: 12 17 16 14   Temp:      TempSrc:      SpO2:  99% 99% 99%  Weight:      Height:        Medications  iopamidol (ISOVUE-370) 76 % injection (not administered)  iopamidol (ISOVUE-370) 76 % injection (100 mLs  Contrast Given 09/10/16 1921)  HYDROmorphone (DILAUDID) injection 1 mg (1 mg Intramuscular Given 09/10/16 2147)    Takiah Maiden is 65 y.o. female presenting with chest pain and right upper quadrant pain, states it significantly worsening over the course of the last 24 hours. Pulses equal bilaterally, EKG with no acute changes, troponin negative. Have considered obtaining dissection study however, attending physician has evaluated the patient and think this is unlikely. She very tender in the right upper  quadrant, will obtain ultrasound. Ultrasound with cirrhosis, I do not know abnormality of the posterior of the liver however, this is where she had her lesion removed, I would like to keep her here for further imaging however she has to go take care of her grandchildren. She has an MRI scheduled for the 25th, pain is improved in the ED. Has declined admission on multiple conversations.  Evaluation does not show pathology that would require ongoing emergent intervention or inpatient treatment. Pt is hemodynamically stable and mentating appropriately. Discussed findings and plan with patient/guardian, who agrees with care plan. All questions answered. Return precautions discussed and outpatient follow up given.      Final Clinical Impressions(s) / ED Diagnoses   Final diagnoses:  Pain  RUQ pain  Abnormal liver ultrasound    New Prescriptions Discharge Medication List as of 09/10/2016 11:50 PM    START taking these medications   Details  traMADol (ULTRAM) 50 MG tablet Take 1 tablet (50 mg total) by mouth every 6 (six) hours as needed., Starting Thu 09/10/2016, [employer], PA-C 09/11/16 1914    Glynn Octave, MD 09/12/16 1044

## 2016-09-10 NOTE — ED Triage Notes (Signed)
Pt states yesterday she started having some right side abd pain and sharp pains in her chest . Pt reports having a tumor removed from her liver at [facility] over one month ago and pain around the area.

## 2016-09-10 NOTE — ED Notes (Signed)
Patient transported to CT 

## 2016-10-23 DEATH — deceased

## 2017-08-16 IMAGING — DX DG CHEST 2V
2 series · 2 of 2 positions shown · non-contrast
Comparison: Chest radiograph performed 01/07/2016

CLINICAL DATA: Acute onset of cough.  Initial encounter.

EXAM:
CHEST  2 VIEW

[chest lat]
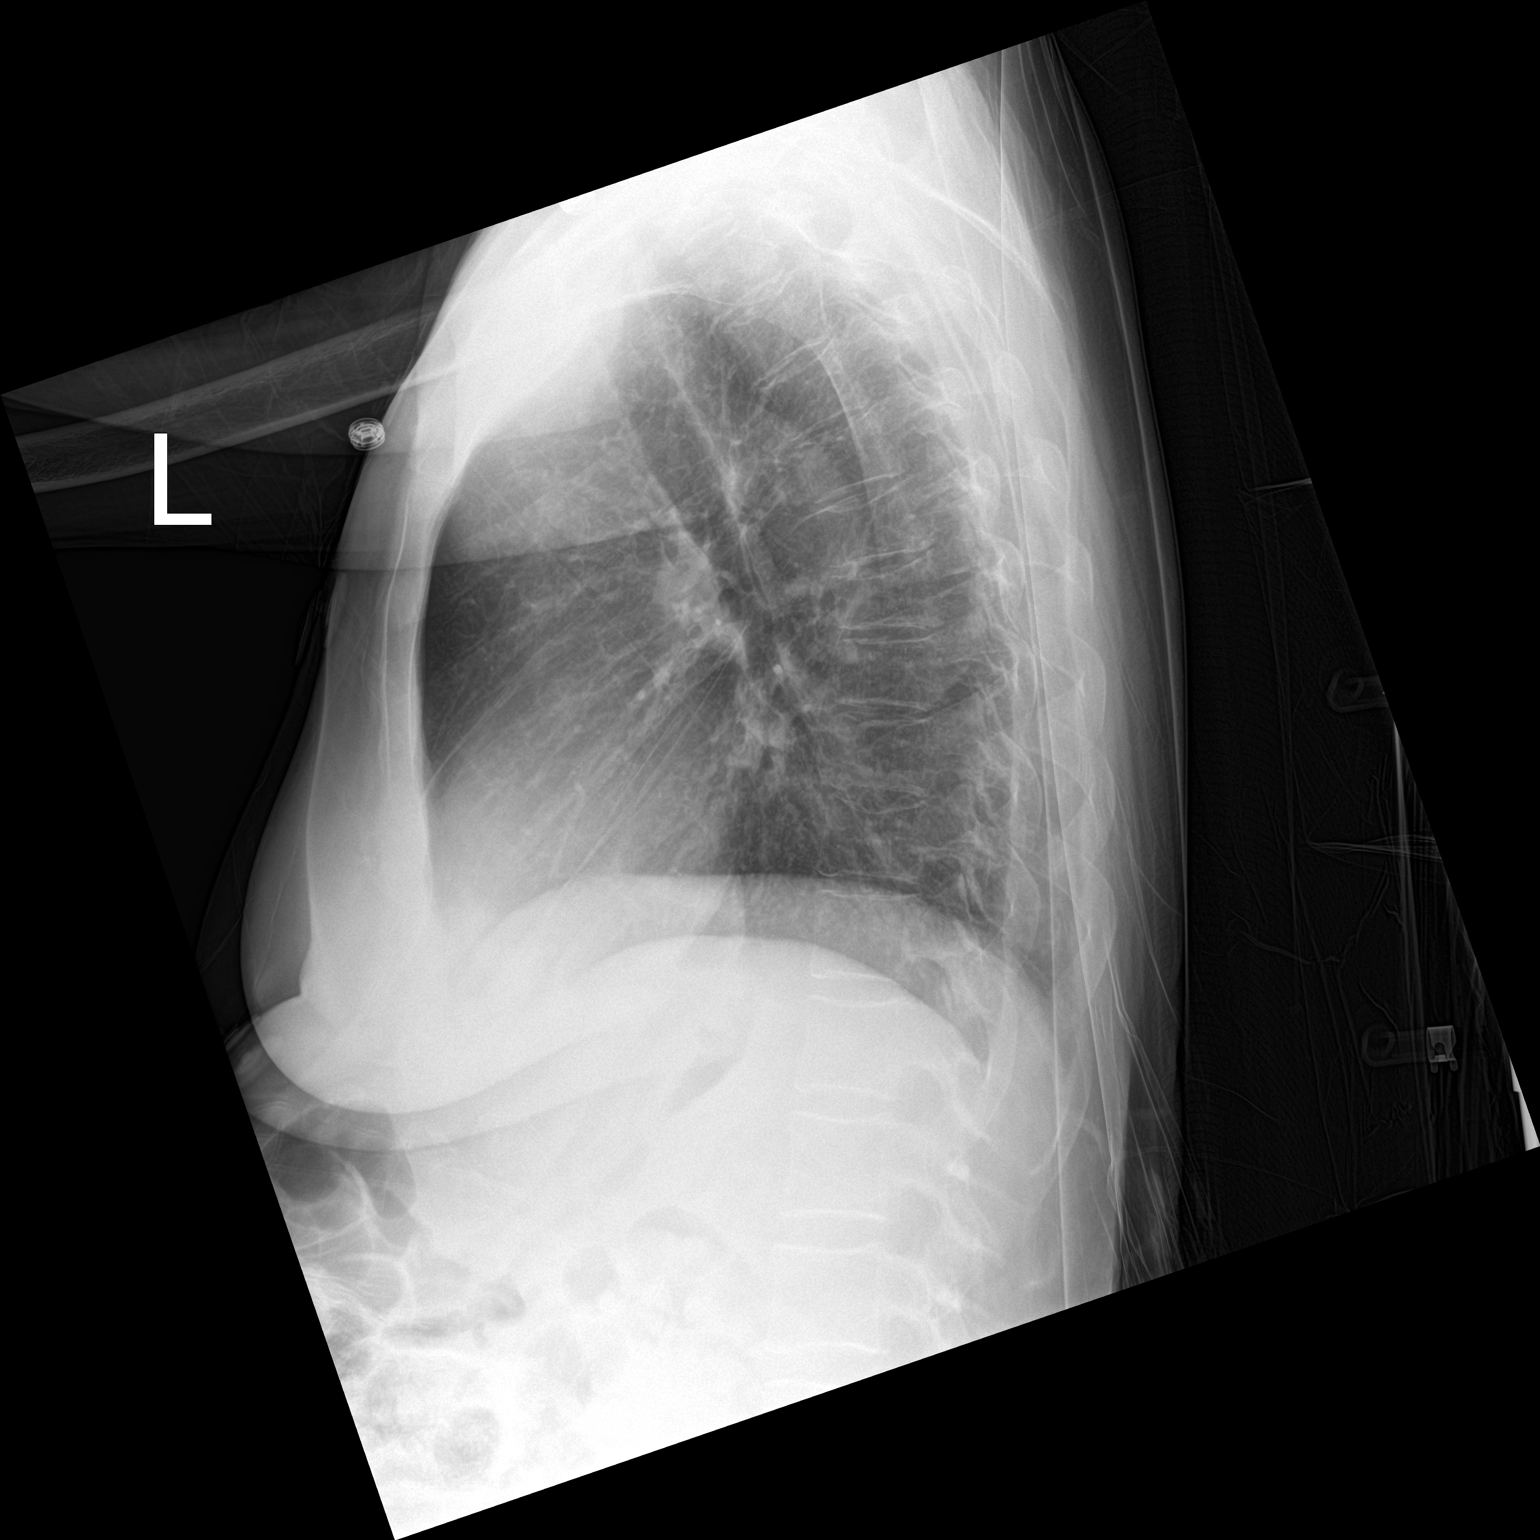

[chest ap]
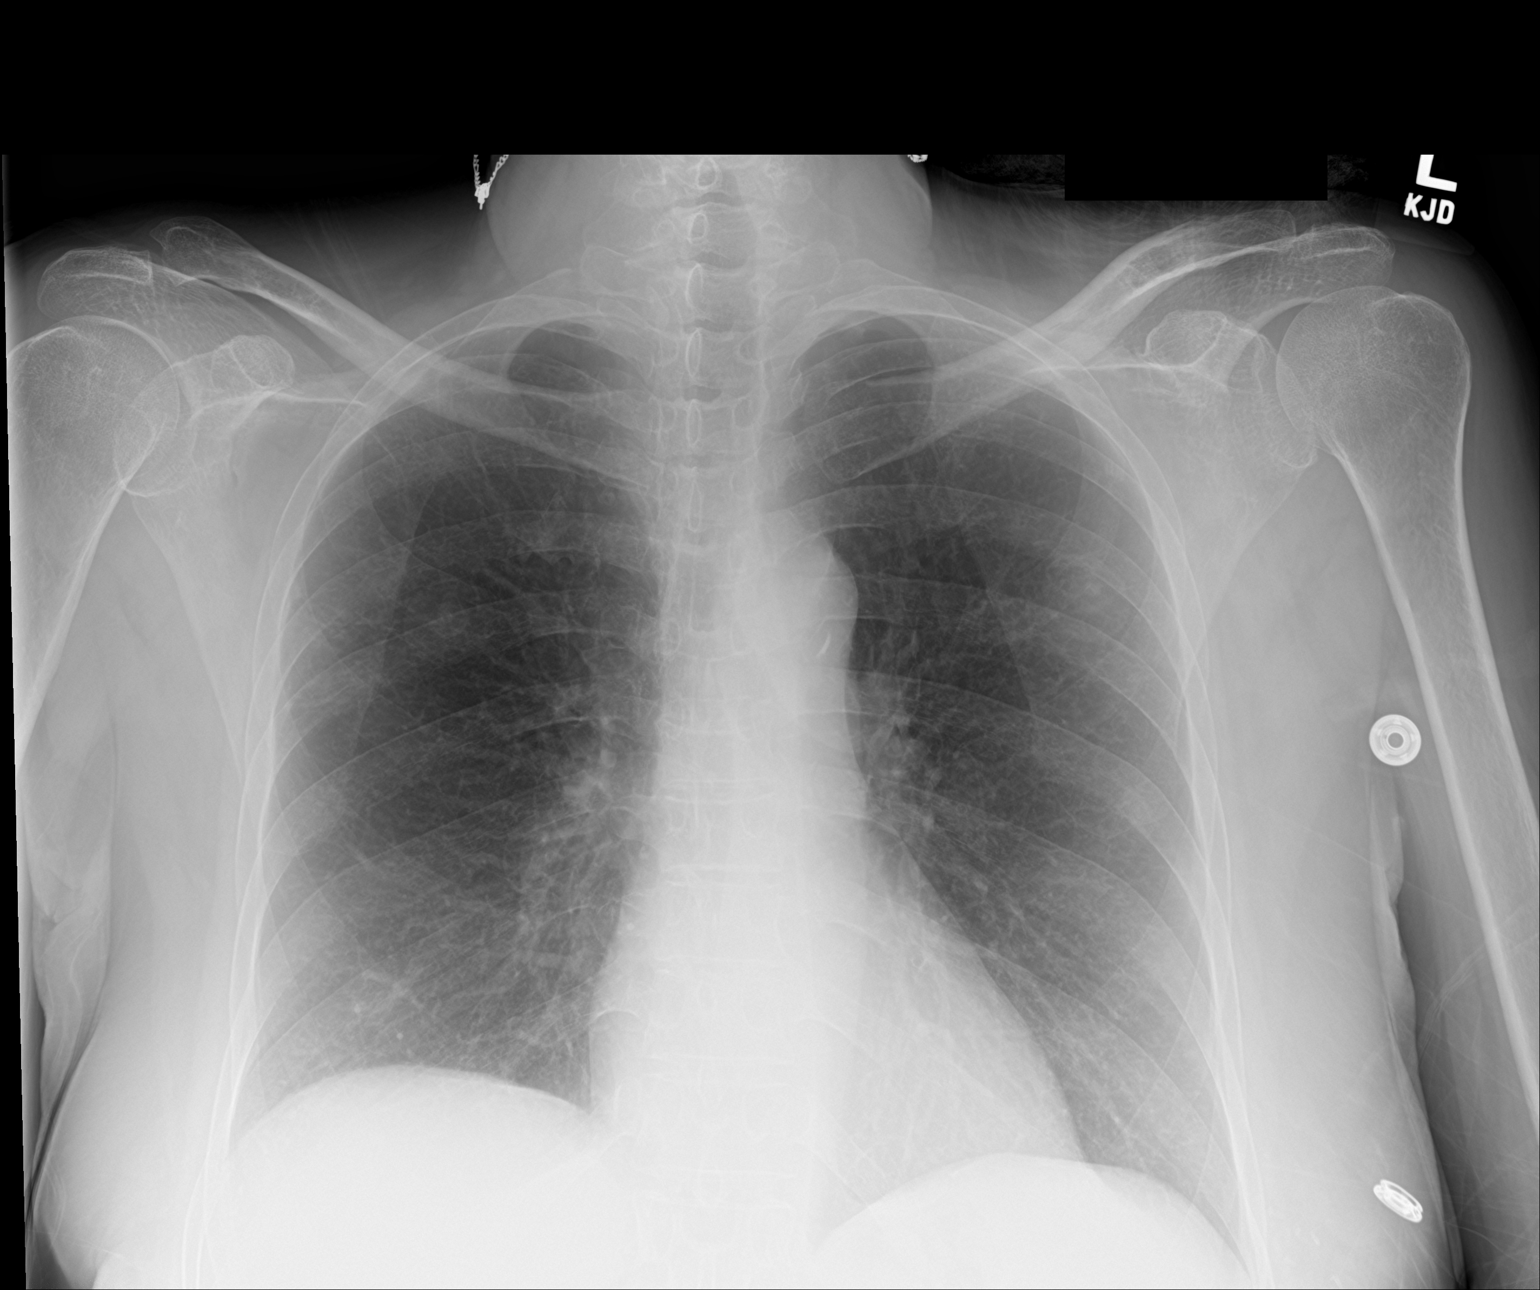

[2 of 2 positions shown; findings below may reference images not displayed]

FINDINGS: The lungs are well-aerated. Mild peribronchial thickening is noted.
There is no evidence of focal opacification, pleural effusion or
pneumothorax.

The heart is normal in size; the mediastinal contour is within
normal limits. No acute osseous abnormalities are seen.
IMPRESSION: Mild peribronchial thickening noted.  Lungs otherwise grossly clear.
